# Patient Record
Sex: Male | Born: 1965 | Race: White | Hispanic: No | Marital: Married | Smoking: Current some day smoker
Health system: Southern US, Community
[De-identification: ages and names within clinical notes are randomized; demographics above are authoritative.]

## PROBLEM LIST (undated history)

## (undated) DIAGNOSIS — E785 Hyperlipidemia, unspecified: Secondary | ICD-10-CM

## (undated) DIAGNOSIS — F329 Major depressive disorder, single episode, unspecified: Secondary | ICD-10-CM

## (undated) DIAGNOSIS — F32A Depression, unspecified: Secondary | ICD-10-CM

---

## 2006-07-08 ENCOUNTER — Emergency Department: Payer: Self-pay | Admitting: General Practice

## 2009-11-19 ENCOUNTER — Emergency Department: Payer: Self-pay | Admitting: Emergency Medicine

## 2012-01-03 ENCOUNTER — Emergency Department: Payer: Self-pay | Admitting: Emergency Medicine

## 2012-01-08 ENCOUNTER — Emergency Department: Payer: Self-pay | Admitting: Emergency Medicine

## 2012-12-29 ENCOUNTER — Emergency Department: Payer: Self-pay | Admitting: Emergency Medicine

## 2013-01-08 ENCOUNTER — Emergency Department: Payer: Self-pay | Admitting: Internal Medicine

## 2014-03-06 ENCOUNTER — Emergency Department: Payer: Self-pay | Admitting: Student

## 2014-04-04 ENCOUNTER — Emergency Department: Payer: Self-pay | Admitting: Internal Medicine

## 2014-05-05 ENCOUNTER — Emergency Department: Payer: Self-pay | Admitting: Emergency Medicine

## 2014-05-05 LAB — CBC WITH DIFFERENTIAL/PLATELET
BASOS PCT: 1.1 %
Basophil #: 0.1 10*3/uL (ref 0.0–0.1)
EOS PCT: 5.8 %
Eosinophil #: 0.3 10*3/uL (ref 0.0–0.7)
HCT: 42.8 % (ref 40.0–52.0)
HGB: 14.3 g/dL (ref 13.0–18.0)
LYMPHS ABS: 2.7 10*3/uL (ref 1.0–3.6)
Lymphocyte %: 45.5 %
MCH: 28.7 pg (ref 26.0–34.0)
MCHC: 33.4 g/dL (ref 32.0–36.0)
MCV: 86 fL (ref 80–100)
Monocyte #: 0.6 x10 3/mm (ref 0.2–1.0)
Monocyte %: 9.7 %
NEUTROS PCT: 37.9 %
Neutrophil #: 2.2 10*3/uL (ref 1.4–6.5)
Platelet: 231 10*3/uL (ref 150–440)
RBC: 4.98 10*6/uL (ref 4.40–5.90)
RDW: 13.3 % (ref 11.5–14.5)
WBC: 5.9 10*3/uL (ref 3.8–10.6)

## 2014-05-05 LAB — BASIC METABOLIC PANEL
ANION GAP: 5 — AB (ref 7–16)
BUN: 21 mg/dL — AB (ref 7–18)
CHLORIDE: 105 mmol/L (ref 98–107)
CREATININE: 1.22 mg/dL (ref 0.60–1.30)
Calcium, Total: 9.3 mg/dL (ref 8.5–10.1)
Co2: 31 mmol/L (ref 21–32)
EGFR (African American): >60
EGFR (Non-African Amer.): >60
GLUCOSE: 105 mg/dL — AB (ref 65–99)
Osmolality: 285 (ref 275–301)
POTASSIUM: 4.9 mmol/L (ref 3.5–5.1)
Sodium: 141 mmol/L (ref 136–145)

## 2014-05-05 LAB — URINALYSIS, COMPLETE
Bacteria: NONE SEEN
Bilirubin,UR: NEGATIVE
Blood: NEGATIVE
Glucose,UR: NEGATIVE mg/dL (ref 0–75)
KETONE: NEGATIVE
Leukocyte Esterase: NEGATIVE
Nitrite: NEGATIVE
PH: 5 (ref 4.5–8.0)
Protein: NEGATIVE
SPECIFIC GRAVITY: 1.019 (ref 1.003–1.030)
SQUAMOUS EPITHELIAL: NONE SEEN
WBC UR: 1 /HPF (ref 0–5)

## 2014-07-07 ENCOUNTER — Emergency Department
Admit: 2014-07-07 | Disposition: A | Discharge status: Home / Self Care | Payer: Self-pay | Admitting: Emergency Medicine

## 2015-10-27 IMAGING — US US SCROTUM W/ DOPPLER COMPLETE
1 series · 14 of 25 positions shown · non-contrast
Comparison: None.

CLINICAL DATA: Right testicular pain for 1 week.

EXAM:
SCROTAL ULTRASOUND
DOPPLER ULTRASOUND OF THE TESTICLES
TECHNIQUE: Complete ultrasound examination of the testicles, epididymis, and
other scrotal structures was performed. Color and spectral Doppler
ultrasound were also utilized to evaluate blood flow to the
testicles.

[Series 1: us scrotum w/ doppler complete · 0.08mm/px · 14 of 64 slices shown]
[im 1/64]
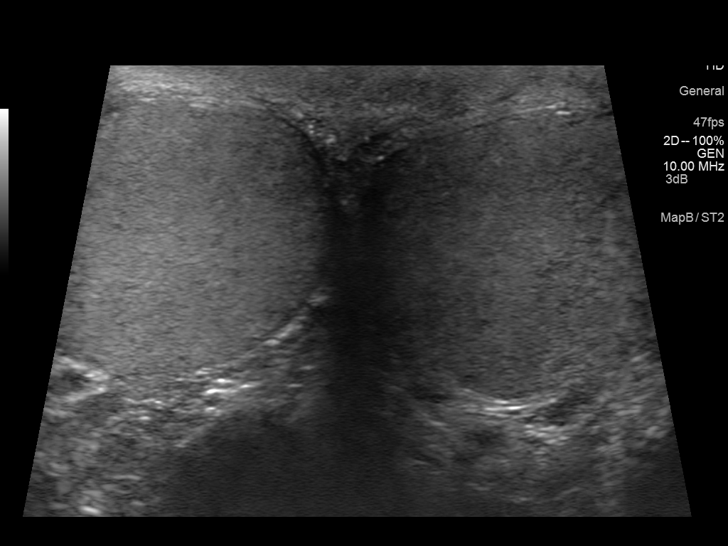
[im 6/64]
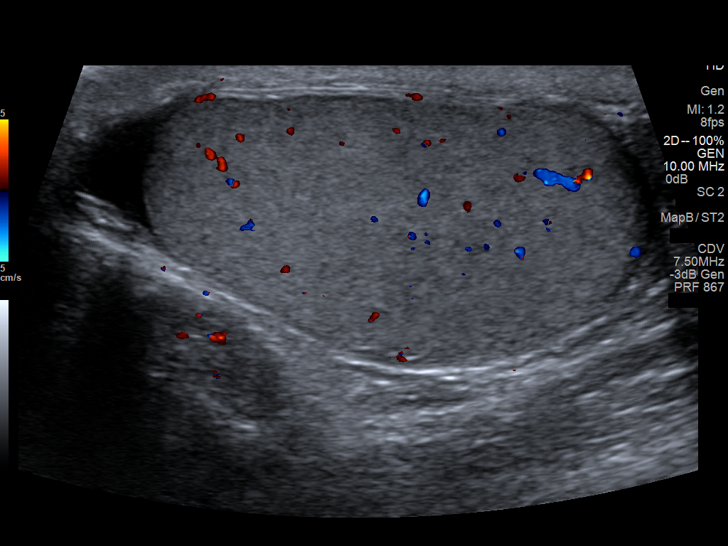
[im 11/64]
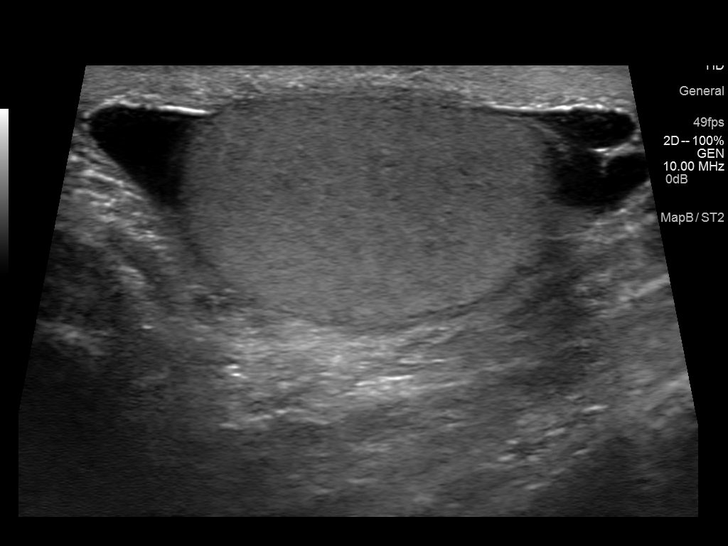
[im 16/64]
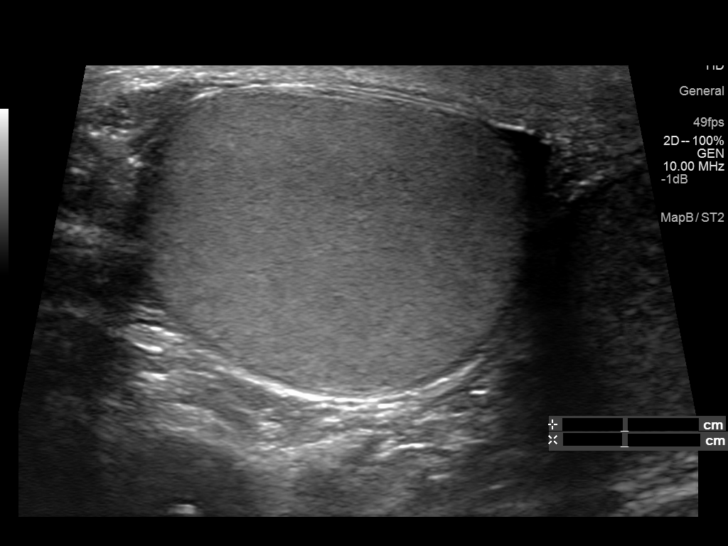
[im 22/64]
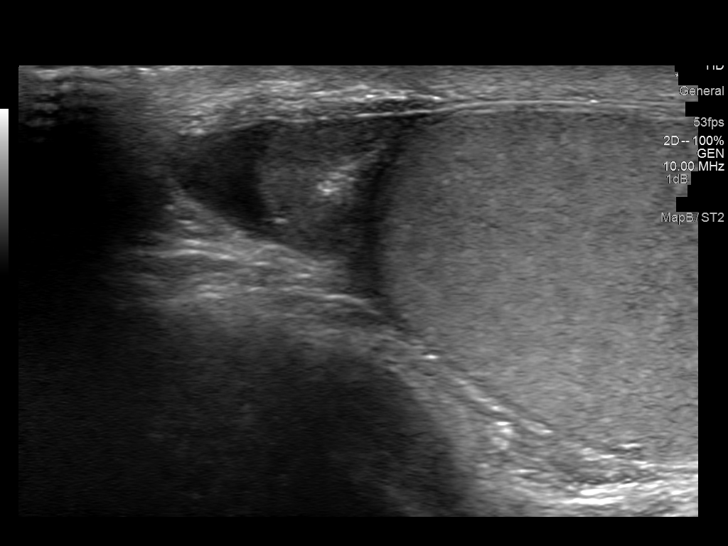
[im 24/64]
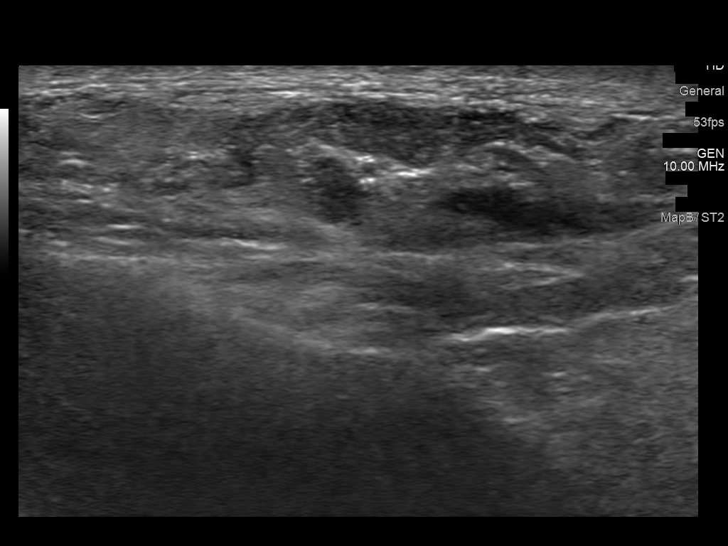
[im 29/64]
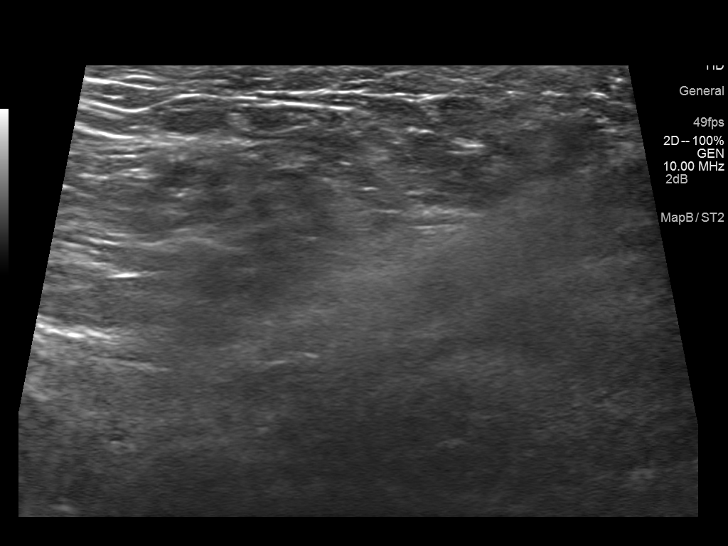
[im 35/64]
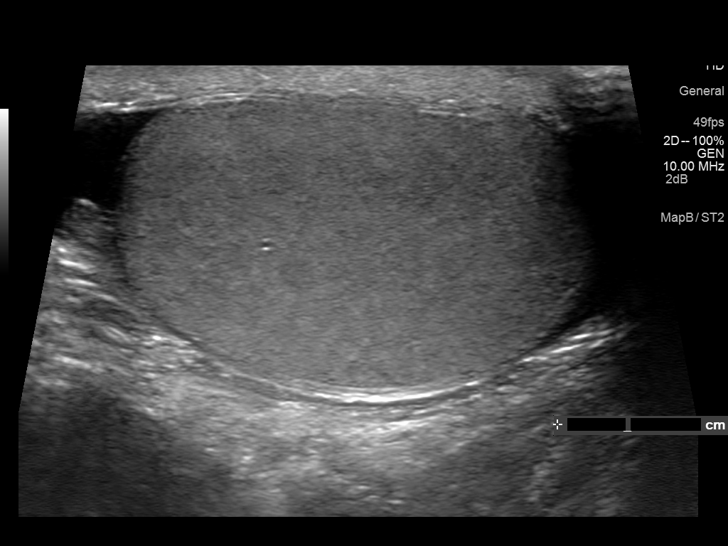
[im 40/64]
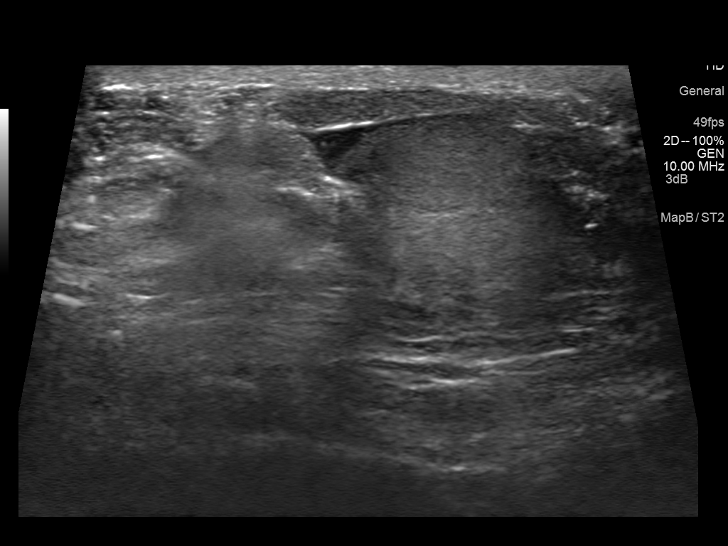
[im 43/64]
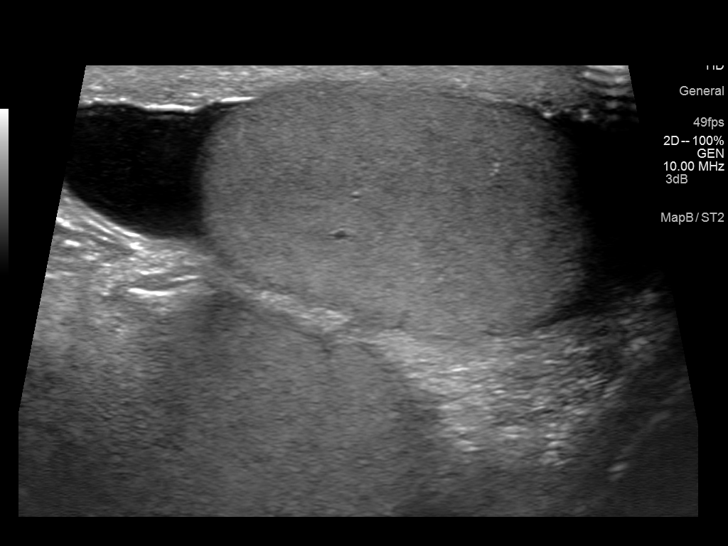
[im 48/64]
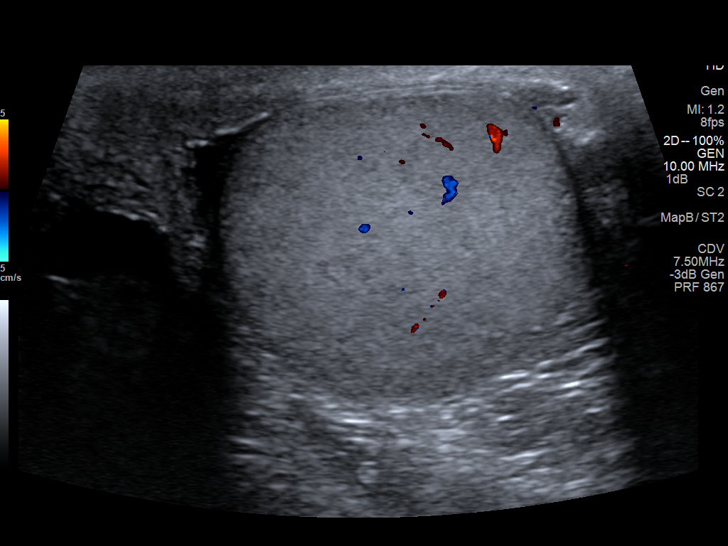
[im 53/64]
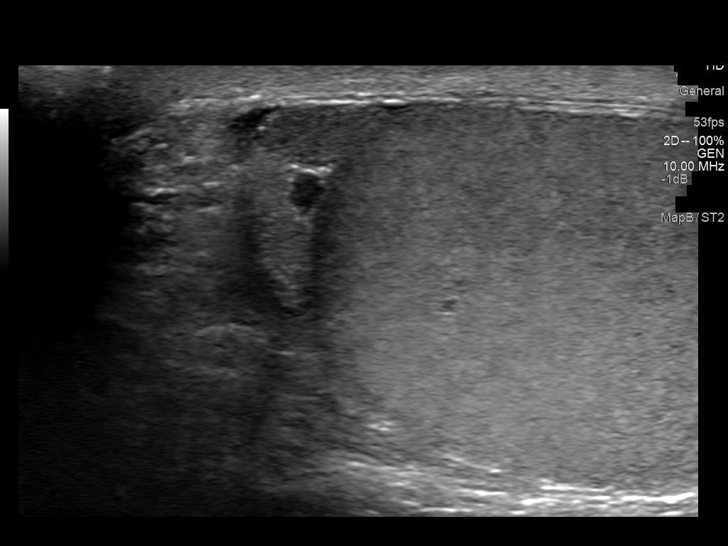
[im 58/64]
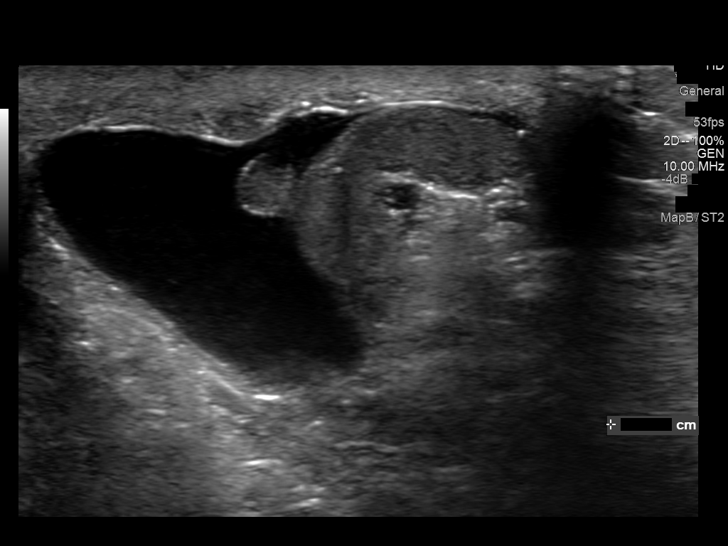
[im 64/64]
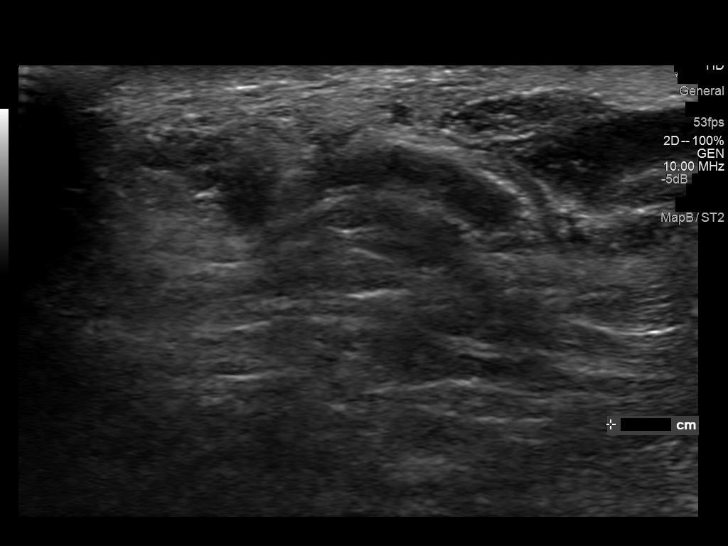

[14 of 25 positions shown; findings below may reference images not displayed]

FINDINGS: Right testicle

Measurements: 5.3 x 3.2 x 4.1 cm. No mass or microlithiasis
visualized.

Left testicle

Measurements: 5 x 3.8 x 3.5 cm. No mass or microlithiasis
visualized.

Right epididymis:  Normal in size and appearance.

Left epididymis: Normal in size and vascularity. Incidental 4 mm
cyst.

Hydrocele:  Trace bilateral hydrocele.

Varicocele: Mild left varicocele with testicular veins dilating up
to 4 mm

Pulsed Doppler interrogation of both testes demonstrates low
resistance arterial and venous waveforms bilaterally.
IMPRESSION: 1. No acute findings to explain pain.  No testicular torsion.
2. Mild left varicocele.

## 2016-09-05 ENCOUNTER — Encounter: Payer: Self-pay | Admitting: Emergency Medicine

## 2016-09-05 ENCOUNTER — Emergency Department
Admission: EM | Admit: 2016-09-05 | Discharge: 2016-09-05 | Disposition: A | Discharge status: Home / Self Care | Payer: Non-veteran care | Attending: Emergency Medicine | Admitting: Emergency Medicine

## 2016-09-05 DIAGNOSIS — Z23 Encounter for immunization: Secondary | ICD-10-CM | POA: Insufficient documentation

## 2016-09-05 DIAGNOSIS — S6992XA Unspecified injury of left wrist, hand and finger(s), initial encounter: Secondary | ICD-10-CM | POA: Diagnosis present

## 2016-09-05 DIAGNOSIS — W231XXA Caught, crushed, jammed, or pinched between stationary objects, initial encounter: Secondary | ICD-10-CM | POA: Insufficient documentation

## 2016-09-05 DIAGNOSIS — S61217A Laceration without foreign body of left little finger without damage to nail, initial encounter: Secondary | ICD-10-CM | POA: Diagnosis not present

## 2016-09-05 DIAGNOSIS — Y929 Unspecified place or not applicable: Secondary | ICD-10-CM | POA: Diagnosis not present

## 2016-09-05 DIAGNOSIS — L237 Allergic contact dermatitis due to plants, except food: Secondary | ICD-10-CM | POA: Insufficient documentation

## 2016-09-05 DIAGNOSIS — F17200 Nicotine dependence, unspecified, uncomplicated: Secondary | ICD-10-CM | POA: Diagnosis not present

## 2016-09-05 DIAGNOSIS — Y998 Other external cause status: Secondary | ICD-10-CM | POA: Insufficient documentation

## 2016-09-05 DIAGNOSIS — Y9389 Activity, other specified: Secondary | ICD-10-CM | POA: Diagnosis not present

## 2016-09-05 HISTORY — DX: Depression, unspecified: F32.A

## 2016-09-05 HISTORY — DX: Major depressive disorder, single episode, unspecified: F32.9

## 2016-09-05 MED ORDER — TETANUS-DIPHTH-ACELL PERTUSSIS 5-2.5-18.5 LF-MCG/0.5 IM SUSP
0.5000 mL | Freq: Once | INTRAMUSCULAR | Status: AC
  Administered 2016-09-05: 0.5 mL via INTRAMUSCULAR
  Filled 2016-09-05: qty 0.5

## 2016-09-05 MED ORDER — HYDROCORTISONE 1 % EX LOTN: 1.0000 "application " | TOPICAL_LOTION | Freq: Two times a day (BID) | CUTANEOUS | 0 refills | Status: DC

## 2016-09-05 NOTE — ED Triage Notes (Signed)
States he caught his left 5 th finger tailgate latch  Laceration to finger

## 2016-09-05 NOTE — Discharge Instructions (Signed)
Keep wound clean, dry and covered. If the wounds begins to show signs of infection: redness, warmth, drainage, etc return to the emergency department.

## 2016-09-05 NOTE — ED Provider Notes (Signed)
[facility] Emergency Department Provider Note   ____________________________________________   I have reviewed the triage vital signs and the nursing notes.   HISTORY  Chief Complaint Laceration    HPI Bobby Zuniga is a 51 y.o. male presents with small laceration to the fifth digit of the left hand. Patient describes attempting to unhook trailer earlier today when his finger got caught in the latch release sustaining the laceration. Patient maintained hemorrhage control until arriving to the emergency department. Patient unable to recall his tetanus status. He also complained of rash along the left waistline he confirmed he had been exposed to poison ivy. Patient reported itching and irritation along the rash area. Patient denies fever, chills, headache, vision changes, chest pain, chest tightness, shortness of breath, abdominal pain, nausea and vomiting.  Past Medical History:  Diagnosis Date  . Depression     There are no active problems to display for this patient.   History reviewed. No pertinent surgical history.  Prior to Admission medications   Medication Sig Start Date End Date Taking? Authorizing Provider  hydrocortisone 1 % lotion Apply 1 application topically 2 (two) times daily. 09/05/16   Dillan Candela M, PA-C    Allergies Patient has no known allergies.  No family history on file.  Social History Social History  Substance Use Topics  . Smoking status: Current Some Day Smoker  . Smokeless tobacco: Never Used  . Alcohol use No    Review of Systems Constitutional: Negative for fever/chills Eyes: No visual changes. ENT:  Negative for sore throat and for difficulty swallowing Cardiovascular: Denies chest pain. Respiratory: Denies cough Denies shortness of breath. Musculoskeletal: Negative for back pain. Negative for generalized body aches. Skin:Positive for rash along the left waist line. One and a half superficial laceration  to the left fifth digit.  Neurological: Negative for headaches.  Negative focal weakness or numbness. ____________________________________________   PHYSICAL EXAM:  VITAL SIGNS: ED Triage Vitals  Enc Vitals Group     BP 09/05/16 1532 132/82     Pulse --      Resp 09/05/16 1532 18     Temp 09/05/16 1532 98.3 F (36.8 C)     Temp Source 09/05/16 1532 Oral     SpO2 09/05/16 1532 96 %     Weight 09/05/16 1535 248 lb (112.5 kg)     Height 09/05/16 1535 6' (1.829 m)     Head Circumference --      Peak Flow --      Pain Score 09/05/16 1535 2     Pain Loc --      Pain Edu? --      Excl. in GC? --     Constitutional: Alert and oriented. Well appearing and in no acute distress.  Head: Normocephalic and atraumatic. Eyes: Conjunctivae are normal.  Cardiovascular: Normal rate, regular rhythm. Normal distal pulses. Respiratory: Normal respiratory effort. No wheezes/rales/rhonchi.  Musculoskeletal: Nontender with normal range of motion in all extremities. Neurologic: Normal speech and language.  Skin:  Skin is warm, dry and intact. Rash along left side of waist band consistent with poison ivy. Dried blisters healing. Left fifth digit laceration approximately 1.5 7 m superficial requiring Dermabond closure. Psychiatric: Mood and affect are normal.  ____________________________________________   LABS (all labs ordered are listed, but only abnormal results are displayed)  Labs Reviewed - No data to display ____________________________________________  EKG None  ____________________________________________  RADIOLOGY none ____________________________________________   PROCEDURES  Procedure(s) performed: LACERATION REPAIR  Performed by: Clois Comberraci M Onetha Gaffey Authorized by: Clois Comberraci M Zaraya Delauder Consent: Verbal consent obtained. Risks and benefits: risks, benefits and alternatives were discussed Consent given by: patient Patient identity confirmed: provided demographic data Prepped and  Draped in normal sterile fashion Wound explored  Laceration Location: left fifth digit, superficial laceration  Laceration Length: 1.5 cm  No Foreign Bodies seen or palpated  Irrigation method: syringe Amount of cleaning: standard  Skin closure: Dermabond f/b steri-strip   Patient tolerance: Patient tolerated the procedure well with no immediate complications.    Critical Care performed: no ____________________________________________   INITIAL IMPRESSION / ASSESSMENT AND PLAN / ED COURSE  Pertinent labs & imaging results that were available during my care of the patient were reviewed by me and considered in my medical decision making (see chart for details).   Patient sustained a superficial laceration left fifth digit.  Assessment confirmed movement and sensation of the digit before and after wound closure. Laceration required Dermabond f./b steri-strip closure as noted above. Patient tolerated procedure well. Pt instructed to keep wound clean and dry. Patient received tetanus vaccine. Patient's rash consistent with poison ivy dermatitis. Patient will be prescribed hydrocortisone cream to apply as directed. Patient also instructed to watch for signs of infection and return if changes are noted. Patient  informed of clinical course, understand medical decision-making process, and agree with plan.  Patient was advised to follow up with PCP as needed and was also advised to return to the emergency department for symptoms that change or worsen.      ____________________________________________   FINAL CLINICAL IMPRESSION(S) / ED DIAGNOSES  Final diagnoses:  Laceration of left Jayanna Kroeger finger without foreign body without damage to nail, initial encounter  Contact dermatitis due to poison ivy       NEW MEDICATIONS STARTED DURING THIS VISIT:  Discharge Medication List as of 09/05/2016  4:15 PM    START taking these medications   Details  hydrocortisone 1 % lotion Apply 1  application topically 2 (two) times daily., Starting Tue 09/05/2016, Print         Note:  This document was prepared using Dragon voice recognition software and may include unintentional dictation errors.   Percell BostonLittle, Albaro Deviney M, PA-C 09/05/16 Orland [id number]    Kinner, Robert, MD 09/08/16 [phone]

## 2017-05-07 ENCOUNTER — Encounter: Payer: Self-pay | Admitting: Emergency Medicine

## 2017-05-07 ENCOUNTER — Other Ambulatory Visit: Payer: Self-pay

## 2017-05-07 ENCOUNTER — Emergency Department
Admission: EM | Admit: 2017-05-07 | Discharge: 2017-05-07 | Disposition: A | Discharge status: Home / Self Care | Payer: Non-veteran care | Attending: Emergency Medicine | Admitting: Emergency Medicine

## 2017-05-07 ENCOUNTER — Emergency Department: Payer: Non-veteran care

## 2017-05-07 DIAGNOSIS — F17200 Nicotine dependence, unspecified, uncomplicated: Secondary | ICD-10-CM | POA: Diagnosis not present

## 2017-05-07 DIAGNOSIS — J101 Influenza due to other identified influenza virus with other respiratory manifestations: Secondary | ICD-10-CM | POA: Diagnosis not present

## 2017-05-07 DIAGNOSIS — R509 Fever, unspecified: Secondary | ICD-10-CM | POA: Diagnosis present

## 2017-05-07 DIAGNOSIS — F329 Major depressive disorder, single episode, unspecified: Secondary | ICD-10-CM | POA: Insufficient documentation

## 2017-05-07 LAB — INFLUENZA PANEL BY PCR (TYPE A & B)
INFLAPCR: POSITIVE — AB
INFLBPCR: NEGATIVE

## 2017-05-07 MED ORDER — OSELTAMIVIR PHOSPHATE 75 MG PO CAPS: 75.0000 mg | ORAL_CAPSULE | Freq: Two times a day (BID) | ORAL | 0 refills | Status: AC

## 2017-05-07 MED ORDER — HYDROCOD POLST-CPM POLST ER 10-8 MG/5ML PO SUER: 5.0000 mL | Freq: Two times a day (BID) | ORAL | 0 refills | Status: DC | PRN

## 2017-05-07 NOTE — Discharge Instructions (Signed)
Follow-up with your [facility] the [facility] if any continued problems or not improving.  Return to the emergency department if any severe worsening of your symptoms.  Continue your regular medication.  Begin Tamiflu 75 mg twice daily for 5 days and Tussionex every 12 hours if needed for cough.  Increase fluids.  Tylenol as needed for fever.

## 2017-05-07 NOTE — ED Provider Notes (Signed)
[facility] Emergency Department Provider Note  ___________________________________________   First MD Initiated Contact with Patient 05/07/17 1425     (approximate)  I have reviewed the triage vital signs and the nursing notes.   HISTORY  Chief Complaint Cough  HPI Bobby Zuniga is a 52 y.o. male is here with complaint of sudden onset chills and fever that started yesterday.  Patient states he is also been coughing and short of breath.  Patient's family member states that his temperature was "111"  this morning and was given ibuprofen at approximately 10 AM.  Patient denies any nausea, vomiting or diarrhea.  Patient is a smoker.     Past Medical History:  Diagnosis Date  . Depression     There are no active problems to display for this patient.   History reviewed. No pertinent surgical history.  Prior to Admission medications   Medication Sig Start Date End Date Taking? Authorizing Provider  chlorpheniramine-HYDROcodone (TUSSIONEX PENNKINETIC ER) 10-8 MG/5ML SUER Take 5 mLs by mouth every 12 (twelve) hours as needed for cough. 05/07/17   Tommi Rumps, PA-C  hydrocortisone 1 % lotion Apply 1 application topically 2 (two) times daily. 09/05/16   Little, Traci M, PA-C  oseltamivir (TAMIFLU) 75 MG capsule Take 1 capsule (75 mg total) by mouth 2 (two) times daily for 5 days. 05/07/17 05/12/17  Tommi Rumps, PA-C    Allergies Patient has no known allergies.  No family history on file.  Social History Social History   Tobacco Use  . Smoking status: Current Some Day Smoker  . Smokeless tobacco: Never Used  Substance Use Topics  . Alcohol use: No  . Drug use: No    Review of Systems Constitutional: Positive fever/chills Eyes: No visual changes. ENT: No sore throat. Cardiovascular: Denies chest pain. Respiratory: Denies shortness of breath.  Positive cough. Gastrointestinal: No abdominal pain.  No nausea, no vomiting.  No diarrhea.     Musculoskeletal: Negative for back pain. Skin: Negative for rash. Neurological: Negative for headaches, focal weakness or numbness. ____________________________________________   PHYSICAL EXAM:  VITAL SIGNS: ED Triage Vitals  Enc Vitals Group     BP 05/07/17 1407 (!) 142/99     Pulse Rate 05/07/17 1407 89     Resp 05/07/17 1407 16     Temp 05/07/17 1407 99 F (37.2 C)     Temp Source 05/07/17 1407 Oral     SpO2 --      Weight 05/07/17 1406 247 lb (112 kg)     Height 05/07/17 1406 6' (1.829 m)     Head Circumference --      Peak Flow --      Pain Score 05/07/17 1406 8     Pain Loc --      Pain Edu? --      Excl. in GC? --    Constitutional: Alert and oriented. Well appearing and in no acute distress. Eyes: Conjunctivae are normal.  Head: Atraumatic. Nose: Mild congestion/rhinnorhea. Mouth/Throat: Mucous membranes are moist.  Oropharynx non-erythematous. Neck: No stridor.   Hematological/Lymphatic/Immunilogical: No cervical lymphadenopathy. Cardiovascular: Normal rate, regular rhythm. Grossly normal heart sounds.  Good peripheral circulation. Respiratory: Normal respiratory effort.  No retractions. Lungs CTAB. Gastrointestinal: Soft and nontender. No distention.  Musculoskeletal: Moves upper and lower extremities without any difficulty.  Normal gait was noted. Neurologic:  Normal speech and language. No gross focal neurologic deficits are appreciated.  Skin:  Skin is warm, dry and intact. No rash  noted. Psychiatric: Mood and affect are normal. Speech and behavior are normal.  ____________________________________________   LABS (all labs ordered are listed, but only abnormal results are displayed)  Labs Reviewed  INFLUENZA PANEL BY PCR (TYPE A & B) - Abnormal; Notable for the following components:      Result Value   Influenza A By PCR POSITIVE (*)    All other components within normal limits   RADIOLOGY  ED MD interpretation:   Chest x-ray negative for  pneumonia.  Official radiology report(s): Dg Chest 2 View  Result Date: 05/07/2017 CLINICAL DATA:  Cough and fever. EXAM: CHEST  2 VIEW COMPARISON:  None FINDINGS: The heart size and mediastinal contours are within normal limits. Both lungs are clear except for peribronchial thickening. No effusions. The visualized skeletal structures are unremarkable. IMPRESSION: Bronchitic changes. Electronically Signed   By: Francene BoyersJames  Maxwell M.D.   On: 05/07/2017 15:46    ____________________________________________   PROCEDURES  Procedure(s) performed: None  Procedures  Critical Care performed: No  ____________________________________________   INITIAL IMPRESSION / ASSESSMENT AND PLAN / ED COURSE Patient and family were made aware that his influenza test is positive.  Because symptoms came on suddenly yesterday patient was placed on Tamiflu 75 mg twice daily for 5 days.  He was also given a prescription for Tussionex 1 teaspoon every 12 hours if needed for cough.  He is encouraged to increase fluids and take Tylenol or ibuprofen as needed for fever.  He will [facility] or return to the emergency department if any severe worsening of his symptoms.  ____________________________________________   FINAL CLINICAL IMPRESSION(S) / ED DIAGNOSES  Final diagnoses:  Influenza A     ED Discharge Orders        Ordered    oseltamivir (TAMIFLU) 75 MG capsule  2 times daily     05/07/17 1556    chlorpheniramine-HYDROcodone (TUSSIONEX PENNKINETIC ER) 10-8 MG/5ML SUER  Every 12 hours PRN     05/07/17 1556       Note:  This document was prepared using Dragon voice recognition software and may include unintentional dictation errors.    Tommi RumpsSummers, Rhonda L, PA-C 05/07/17 1621    Emily FilbertWilliams, Jonathan E, MD 05/08/17 [phone]

## 2017-05-07 NOTE — ED Notes (Signed)
See triage note   Developed chills,fever and body aches since last pm  Also has cough  Low grade fever on arrival

## 2017-05-07 NOTE — ED Triage Notes (Addendum)
Arrives with C/O chills and fever yesterday.  Today coughing and SOB.  Ibuprofen given this morning at 1000.

## 2017-11-09 ENCOUNTER — Emergency Department
Admission: EM | Admit: 2017-11-09 | Discharge: 2017-11-09 | Disposition: A | Discharge status: Home / Self Care | Payer: Non-veteran care | Attending: Emergency Medicine | Admitting: Emergency Medicine

## 2017-11-09 ENCOUNTER — Emergency Department: Payer: Non-veteran care

## 2017-11-09 ENCOUNTER — Other Ambulatory Visit: Payer: Self-pay

## 2017-11-09 DIAGNOSIS — S8991XA Unspecified injury of right lower leg, initial encounter: Secondary | ICD-10-CM

## 2017-11-09 DIAGNOSIS — F17210 Nicotine dependence, cigarettes, uncomplicated: Secondary | ICD-10-CM | POA: Diagnosis not present

## 2017-11-09 DIAGNOSIS — M25561 Pain in right knee: Secondary | ICD-10-CM | POA: Diagnosis present

## 2017-11-09 HISTORY — DX: Hyperlipidemia, unspecified: E78.5

## 2017-11-09 MED ORDER — KETOROLAC TROMETHAMINE 30 MG/ML IJ SOLN
30.0000 mg | Freq: Once | INTRAMUSCULAR | Status: AC
  Administered 2017-11-09: 30 mg via INTRAMUSCULAR
  Filled 2017-11-09: qty 1

## 2017-11-09 MED ORDER — KETOROLAC TROMETHAMINE 10 MG PO TABS: 10.0000 mg | ORAL_TABLET | Freq: Four times a day (QID) | ORAL | 0 refills | Status: DC | PRN

## 2017-11-09 NOTE — ED Triage Notes (Signed)
Pt c/o right knee pain, states he thinks he injured it.

## 2017-11-09 NOTE — ED Provider Notes (Signed)
[facility] Emergency Department Provider Note  ____________________________________________  Time seen: Approximately 8:53 AM  I have reviewed the triage vital signs and the nursing notes.   HISTORY  Chief Complaint Knee Injury    HPI Bobby Zuniga is a 52 y.o. male that presents to emergency department for evaluation of right knee pain for 1 week.  Patient states that he was at the gym and was using 1 of the leg machines, when he hyperextended his knee.  He has having pain on the front below his kneecap.  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  He has been walking but with pain.  No alleviating measures have been attempted.  No swelling.  No additional injuries.  Past Medical History:  Diagnosis Date  . Depression   . Hyperlipemia     There are no active problems to display for this patient.   History reviewed. No pertinent surgical history.  Prior to Admission medications   Medication Sig Start Date End Date Taking? Authorizing Provider  chlorpheniramine-HYDROcodone (TUSSIONEX PENNKINETIC ER) 10-8 MG/5ML SUER Take 5 mLs by mouth every 12 (twelve) hours as needed for cough. 05/07/17   Tommi RumpsSummers, Rhonda L, PA-C  hydrocortisone 1 % lotion Apply 1 application topically 2 (two) times daily. 09/05/16   Little, Traci M, PA-C  ketorolac (TORADOL) 10 MG tablet Take 1 tablet (10 mg total) by mouth every 6 (six) hours as needed. 11/09/17   Enid DerryWagner, Velna Hedgecock, PA-C    Allergies Patient has no known allergies.  No family history on file.  Social History Social History   Tobacco Use  . Smoking status: Current Some Day Smoker  . Smokeless tobacco: Never Used  Substance Use Topics  . Alcohol use: No  . Drug use: No     Review of Systems  Musculoskeletal: Positive for knee pain.  Skin: Negative for rash, abrasions, lacerations, ecchymosis. Neurological: Negative for headaches, numbness or  tingling   ____________________________________________   PHYSICAL EXAM:  VITAL SIGNS: ED Triage Vitals  Enc Vitals Group     BP 11/09/17 0847 (!) 142/79     Pulse Rate 11/09/17 0847 75     Resp 11/09/17 0847 17     Temp 11/09/17 0850 98 F (36.7 C)     Temp Source 11/09/17 0847 Oral     SpO2 11/09/17 0847 100 %     Weight 11/09/17 0848 242 lb (109.8 kg)     Height 11/09/17 0848 6' (1.829 m)     Head Circumference --      Peak Flow --      Pain Score 11/09/17 0847 7     Pain Loc --      Pain Edu? --      Excl. in GC? --      Constitutional: Alert and oriented. Well appearing and in no acute distress. Eyes: Conjunctivae are normal. PERRL. EOMI. Head: Atraumatic. ENT:      Ears:      Nose: No congestion/rhinnorhea.      Mouth/Throat: Mucous membranes are moist.  Neck: No stridor.  Cardiovascular: Normal rate, regular rhythm.  Good peripheral circulation. Respiratory: Normal respiratory effort without tachypnea or retractions. Lungs CTAB. Good air entry to the bases with no decreased or absent breath sounds. Musculoskeletal: Full range of motion to all extremities. No gross deformities appreciated.  Tenderness to palpation inferior to patella. No effusion noted. Negative anterior drawer, posterior drawer, valgus, varus, mcMurray, patella apprehension, apley  grind. Neurologic:  Normal speech and language. No gross focal neurologic deficits are appreciated.  Skin:  Skin is warm, dry and intact. No rash noted. Psychiatric: Mood and affect are normal. Speech and behavior are normal. Patient exhibits appropriate insight and judgement.   ____________________________________________   LABS (all labs ordered are listed, but only abnormal results are displayed)  Labs Reviewed - No data to display ____________________________________________  EKG   ____________________________________________  RADIOLOGY Lexine Baton, personally viewed and evaluated these images  (plain radiographs) as part of my medical decision making, as well as reviewing the written report by the radiologist.  Dg Knee Complete 4 Views Right  Result Date: 11/09/2017 CLINICAL DATA:  Right knee pain. EXAM: RIGHT KNEE - COMPLETE 4+ VIEW COMPARISON:  None. FINDINGS: No evidence of fracture, dislocation, or joint effusion. No evidence of arthropathy or other focal bone abnormality. Soft tissues are unremarkable. IMPRESSION: Normal right knee. Electronically Signed   By: Lupita Raider, M.D.   On: 11/09/2017 09:33    ____________________________________________    PROCEDURES  Procedure(s) performed:    Procedures    Medications  ketorolac (TORADOL) 30 MG/ML injection 30 mg (30 mg Intramuscular Given 11/09/17 0954)     ____________________________________________   INITIAL IMPRESSION / ASSESSMENT AND PLAN / ED COURSE  Pertinent labs & imaging results that were available during my care of the patient were reviewed by me and considered in my medical decision making (see chart for details).  Review of the [place] CSRS was performed in accordance of the [facility] prior to dispensing any controlled drugs.   Patient presented to the emergency department for evaluation of knee pain for 1 week.  Vital signs and exam are reassuring.  Knee x-ray negative for bony abnormalities.  Knee exam unremarkable.  Patient will be discharged home with prescriptions for Toradol. Patient is to follow up with Ortho as directed. Patient is given ED precautions to return to the ED for any worsening or new symptoms.     ____________________________________________  FINAL CLINICAL IMPRESSION(S) / ED DIAGNOSES  Final diagnoses:  Injury of right knee, initial encounter      NEW MEDICATIONS STARTED DURING THIS VISIT:  ED Discharge Orders         Ordered    ketorolac (TORADOL) 10 MG tablet  Every 6 hours PRN     11/09/17 0959              This chart was dictated using voice recognition  software/Dragon. Despite best efforts to proofread, errors can occur which can change the meaning. Any change was purely unintentional.    Enid Derry, PA-C 11/09/17 1526    Don Perking, [place], MD 11/10/17 [phone]

## 2018-01-09 ENCOUNTER — Emergency Department
Admission: EM | Admit: 2018-01-09 | Discharge: 2018-01-09 | Disposition: A | Discharge status: Home / Self Care | Payer: Non-veteran care | Attending: Emergency Medicine | Admitting: Emergency Medicine

## 2018-01-09 ENCOUNTER — Encounter: Payer: Self-pay | Admitting: Emergency Medicine

## 2018-01-09 ENCOUNTER — Other Ambulatory Visit: Payer: Self-pay

## 2018-01-09 DIAGNOSIS — Z79899 Other long term (current) drug therapy: Secondary | ICD-10-CM | POA: Diagnosis not present

## 2018-01-09 DIAGNOSIS — Y999 Unspecified external cause status: Secondary | ICD-10-CM | POA: Insufficient documentation

## 2018-01-09 DIAGNOSIS — F17200 Nicotine dependence, unspecified, uncomplicated: Secondary | ICD-10-CM | POA: Insufficient documentation

## 2018-01-09 DIAGNOSIS — S0502XA Injury of conjunctiva and corneal abrasion without foreign body, left eye, initial encounter: Secondary | ICD-10-CM | POA: Diagnosis not present

## 2018-01-09 DIAGNOSIS — Y929 Unspecified place or not applicable: Secondary | ICD-10-CM | POA: Diagnosis not present

## 2018-01-09 DIAGNOSIS — Y93H9 Activity, other involving exterior property and land maintenance, building and construction: Secondary | ICD-10-CM | POA: Diagnosis not present

## 2018-01-09 DIAGNOSIS — X58XXXA Exposure to other specified factors, initial encounter: Secondary | ICD-10-CM | POA: Diagnosis not present

## 2018-01-09 DIAGNOSIS — S0592XA Unspecified injury of left eye and orbit, initial encounter: Secondary | ICD-10-CM | POA: Diagnosis present

## 2018-01-09 MED ORDER — EYE WASH OPHTH SOLN
1.0000 [drp] | OPHTHALMIC | Status: DC | PRN
  Administered 2018-01-09: 1 [drp] via OPHTHALMIC

## 2018-01-09 MED ORDER — FLUORESCEIN SODIUM 1 MG OP STRP
1.0000 | ORAL_STRIP | Freq: Once | OPHTHALMIC | Status: AC
  Administered 2018-01-09: 1 via OPHTHALMIC

## 2018-01-09 MED ORDER — TETRACAINE HCL 0.5 % OP SOLN
1.0000 [drp] | Freq: Once | OPHTHALMIC | Status: AC
  Administered 2018-01-09: 1 [drp] via OPHTHALMIC

## 2018-01-09 MED ORDER — GENTAMICIN SULFATE 0.3 % OP SOLN: 1.0000 [drp] | OPHTHALMIC | 0 refills | Status: DC

## 2018-01-09 MED ORDER — FLUORESCEIN SODIUM 1 MG OP STRP
ORAL_STRIP | OPHTHALMIC | Status: AC
  Administered 2018-01-09: 1 via OPHTHALMIC
  Filled 2018-01-09: qty 1

## 2018-01-09 MED ORDER — EYE WASH OPHTH SOLN
OPHTHALMIC | Status: AC
  Administered 2018-01-09: 1 [drp] via OPHTHALMIC
  Filled 2018-01-09: qty 118

## 2018-01-09 MED ORDER — TETRACAINE HCL 0.5 % OP SOLN
OPHTHALMIC | Status: AC
  Administered 2018-01-09: 1 [drp] via OPHTHALMIC
  Filled 2018-01-09: qty 4

## 2018-01-09 NOTE — Discharge Instructions (Addendum)
Use eye drops as directed.

## 2018-01-09 NOTE — ED Provider Notes (Signed)
[facility] Emergency Department Provider Note   ____________________________________________   First MD Initiated Contact with Patient 01/09/18 1206     (approximate)  I have reviewed the triage vital signs and the nursing notes.   HISTORY  Chief Complaint Eye Problem    HPI Bobby Zuniga is a 52 y.o. male patient complain of left eye pain for 3 days.  Patient incident occurred while he was cutting grass.  Patient state foreign body sensation but no vision disturbance.  Patient state a pharmacist told us that he had a foreign body in his eye.  Patient rates his pain as a 5/10.  Patient described pain is "aching".  No palliative measure for complaint.  Past Medical History:  Diagnosis Date  . Depression   . Hyperlipemia     There are no active problems to display for this patient.   History reviewed. No pertinent surgical history.  Prior to Admission medications   Medication Sig Start Date End Date Taking? Authorizing Provider  chlorpheniramine-HYDROcodone (TUSSIONEX PENNKINETIC ER) 10-8 MG/5ML SUER Take 5 mLs by mouth every 12 (twelve) hours as needed for cough. 05/07/17   Tommi Rumps, PA-C  gentamicin (GARAMYCIN) 0.3 % ophthalmic solution Place 1 drop into the left eye every 4 (four) hours. 01/09/18   Joni Reining, PA-C  hydrocortisone 1 % lotion Apply 1 application topically 2 (two) times daily. 09/05/16   Little, Traci M, PA-C  ketorolac (TORADOL) 10 MG tablet Take 1 tablet (10 mg total) by mouth every 6 (six) hours as needed. 11/09/17   Enid Derry, PA-C    Allergies Patient has no known allergies.  No family history on file.  Social History Social History   Tobacco Use  . Smoking status: Current Some Day Smoker  . Smokeless tobacco: Never Used  Substance Use Topics  . Alcohol use: No  . Drug use: No    Review of Systems Constitutional: No fever/chills Eyes: No visual changes.  Foreign body sensation left eye. ENT: No  sore throat. Cardiovascular: Denies chest pain. Respiratory: Denies shortness of breath. Gastrointestinal: No abdominal pain.  No nausea, no vomiting.  No diarrhea.  No constipation. Genitourinary: Negative for dysuria. Musculoskeletal: Negative for back pain. Skin: Negative for rash. Neurological: Negative for headaches, focal weakness or numbness.   ____________________________________________   PHYSICAL EXAM:  VITAL SIGNS: ED Triage Vitals [01/09/18 1147]  Enc Vitals Group     BP      Pulse      Resp      Temp      Temp src      SpO2      Weight 242 lb 1 oz (109.8 kg)     Height      Head Circumference      Peak Flow      Pain Score 5     Pain Loc      Pain Edu?      Excl. in GC?    Constitutional: Alert and oriented. Well appearing and in no acute distress. Eyes: Conjunctivae are normal. PERRL. EOMI. fluoroscopy stain reveals corneal abrasion but no obvious foreign body. Cardiovascular: Normal rate, regular rhythm. Grossly normal heart sounds.  Good peripheral circulation. Respiratory: Normal respiratory effort.  No retractions. Lungs CTAB. Neurologic:  Normal speech and language. No gross focal neurologic deficits are appreciated. No gait instability. Skin:  Skin is warm, dry and intact. No rash noted. Psychiatric: Mood and affect are normal. Speech and behavior are normal.  ____________________________________________   LABS (all labs ordered are listed, but only abnormal results are displayed)  Labs Reviewed - No data to display ____________________________________________  EKG   ____________________________________________  RADIOLOGY  ED MD interpretation:    Official radiology report(s): No results found.  ____________________________________________   PROCEDURES  Procedure(s) performed: None  Procedures  Critical Care performed: No  ____________________________________________   INITIAL IMPRESSION / ASSESSMENT AND PLAN / ED  COURSE  As part of my medical decision making, I reviewed the following data within the electronic MEDICAL RECORD NUMBER    Patient presents with foreign body sensation left eye secondary corneal abrasion.  Advised patient no foreign body was seen on fluoroscopic exam.  Patient given discharge care instruction advised use eyedrops as directed.  Patient advised to follow-up with ophthalmology if no improvement in 2 days.     ____________________________________________   FINAL CLINICAL IMPRESSION(S) / ED DIAGNOSES  Final diagnoses:  Abrasion of left cornea, initial encounter     ED Discharge Orders         Ordered    gentamicin (GARAMYCIN) 0.3 % ophthalmic solution  Every 4 hours     01/09/18 1217           Note:  This document was prepared using Dragon voice recognition software and may include unintentional dictation errors.    Joni Reining, PA-C 01/09/18 1224    Charlynne Pander, MD 01/10/18 [phone]

## 2018-01-09 NOTE — ED Notes (Signed)
See triage note  Presents with irritation to left eye about 2 days ago states it feels like he has gotten something in eye

## 2018-01-09 NOTE — ED Triage Notes (Signed)
C/O feeling like something is in left eye x 2 days.  States flushed eye out with water, no improvement.

## 2018-04-01 ENCOUNTER — Emergency Department
Admission: EM | Admit: 2018-04-01 | Discharge: 2018-04-01 | Discharge status: Against Medical Advice | Payer: Non-veteran care | Attending: Emergency Medicine | Admitting: Emergency Medicine

## 2018-04-01 DIAGNOSIS — R0602 Shortness of breath: Secondary | ICD-10-CM | POA: Diagnosis not present

## 2018-04-01 DIAGNOSIS — Z5321 Procedure and treatment not carried out due to patient leaving prior to being seen by health care provider: Secondary | ICD-10-CM | POA: Diagnosis not present

## 2018-04-01 NOTE — ED Triage Notes (Signed)
First Nurse Note:  Patietn c/o sob.  Lungs CTA,  Vital Signs checked.  SpO2 98% on RA.  No SOB/ DOE.

## 2019-05-03 IMAGING — CR DG KNEE COMPLETE 4+V*R*
1 series · 4 of 4 positions shown · non-contrast
Comparison: None.

CLINICAL DATA: Right knee pain.

EXAM:
RIGHT KNEE - COMPLETE 4+ VIEW

[Series 1: dg knee complete 4 views right · 0.14mm/px · 4 of 4 slices shown]
[im 1/4]
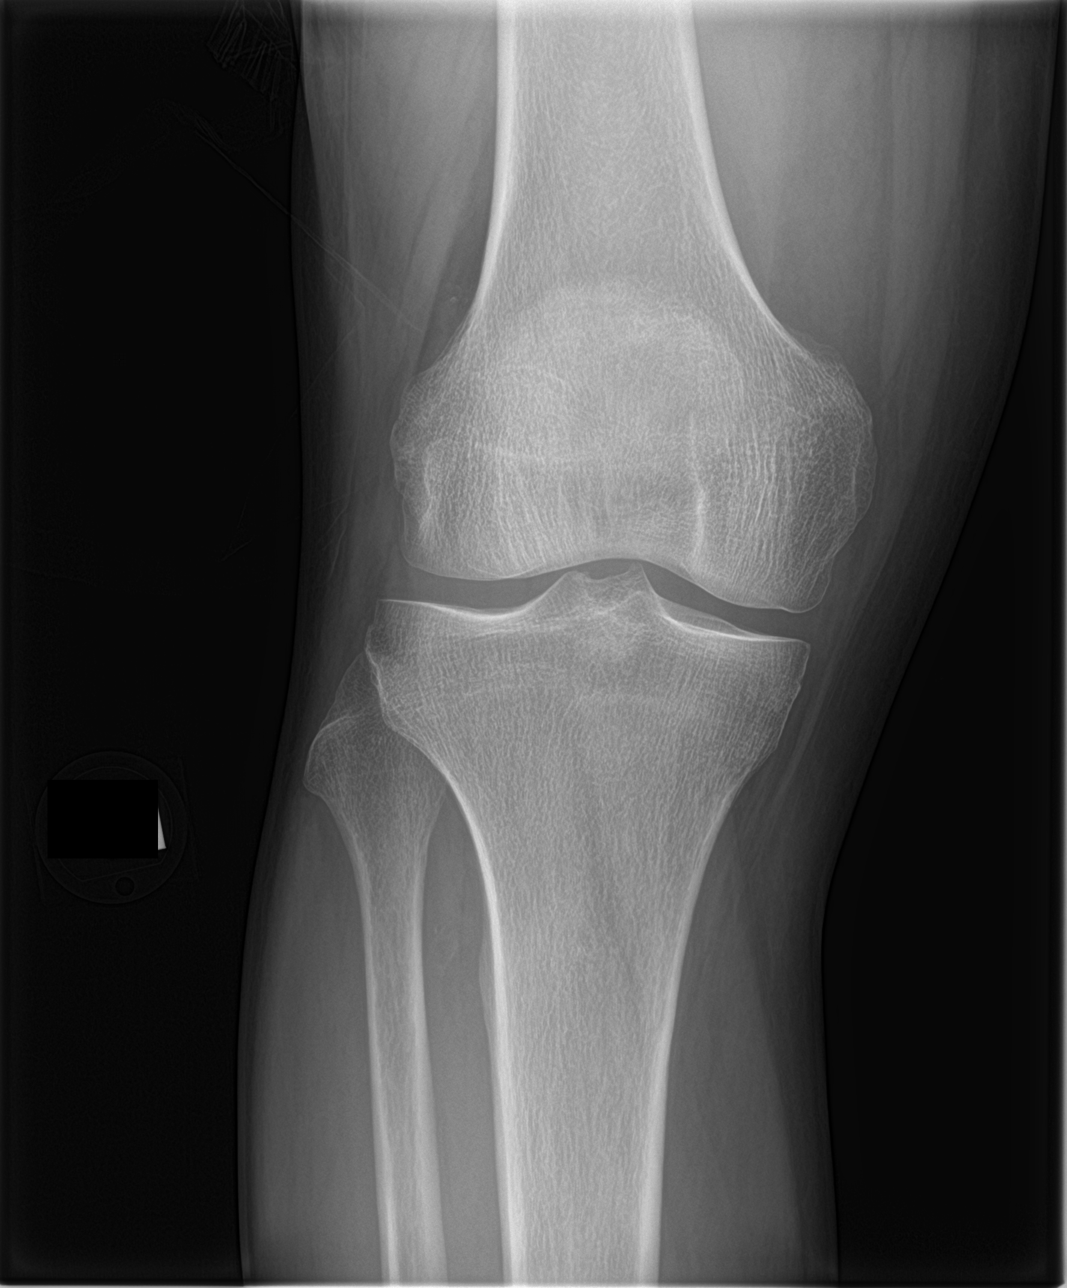
[im 2/4]
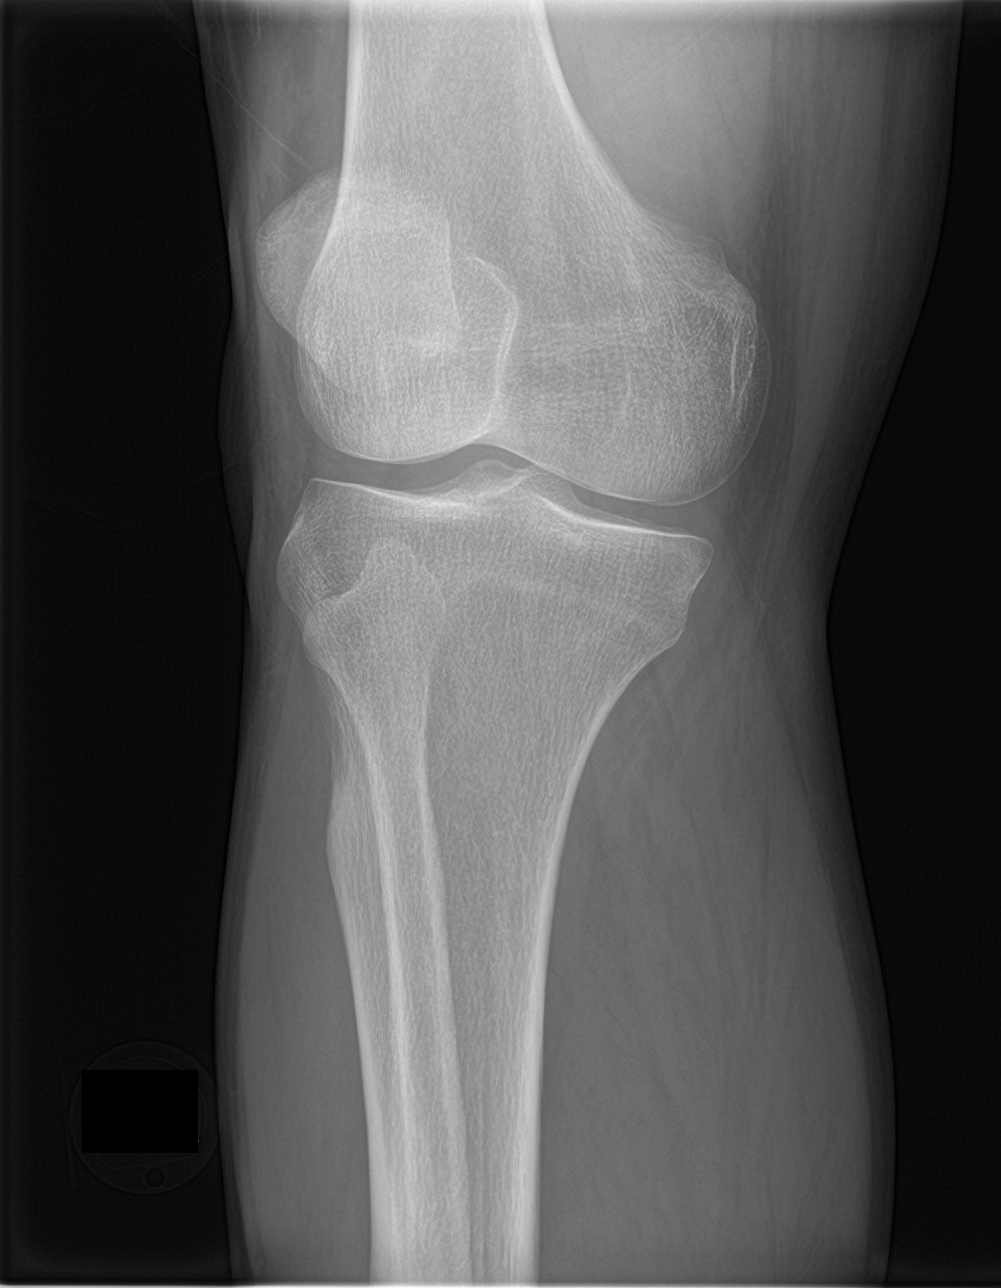
[im 3/4]
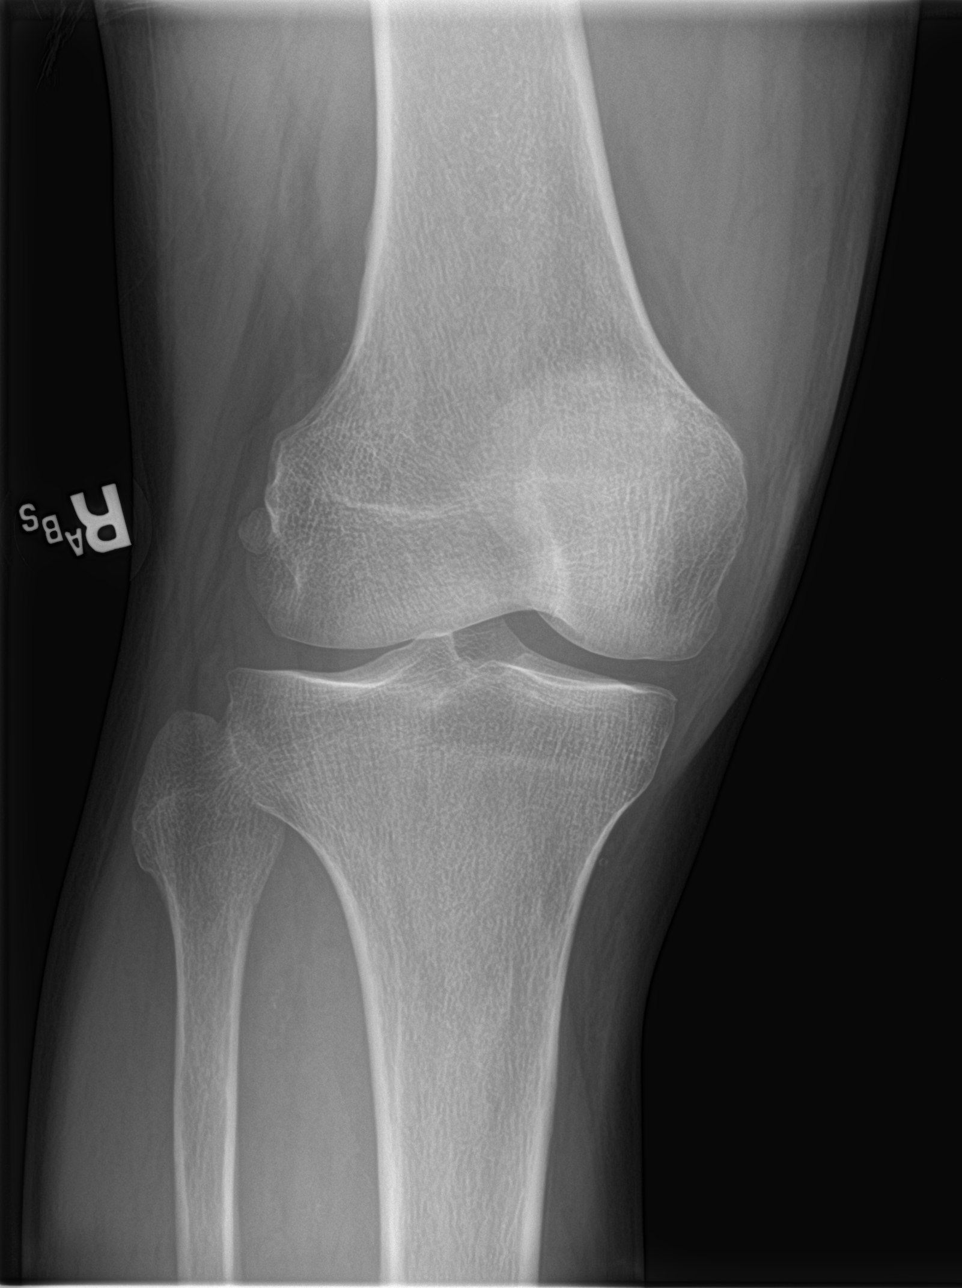
[im 4/4]
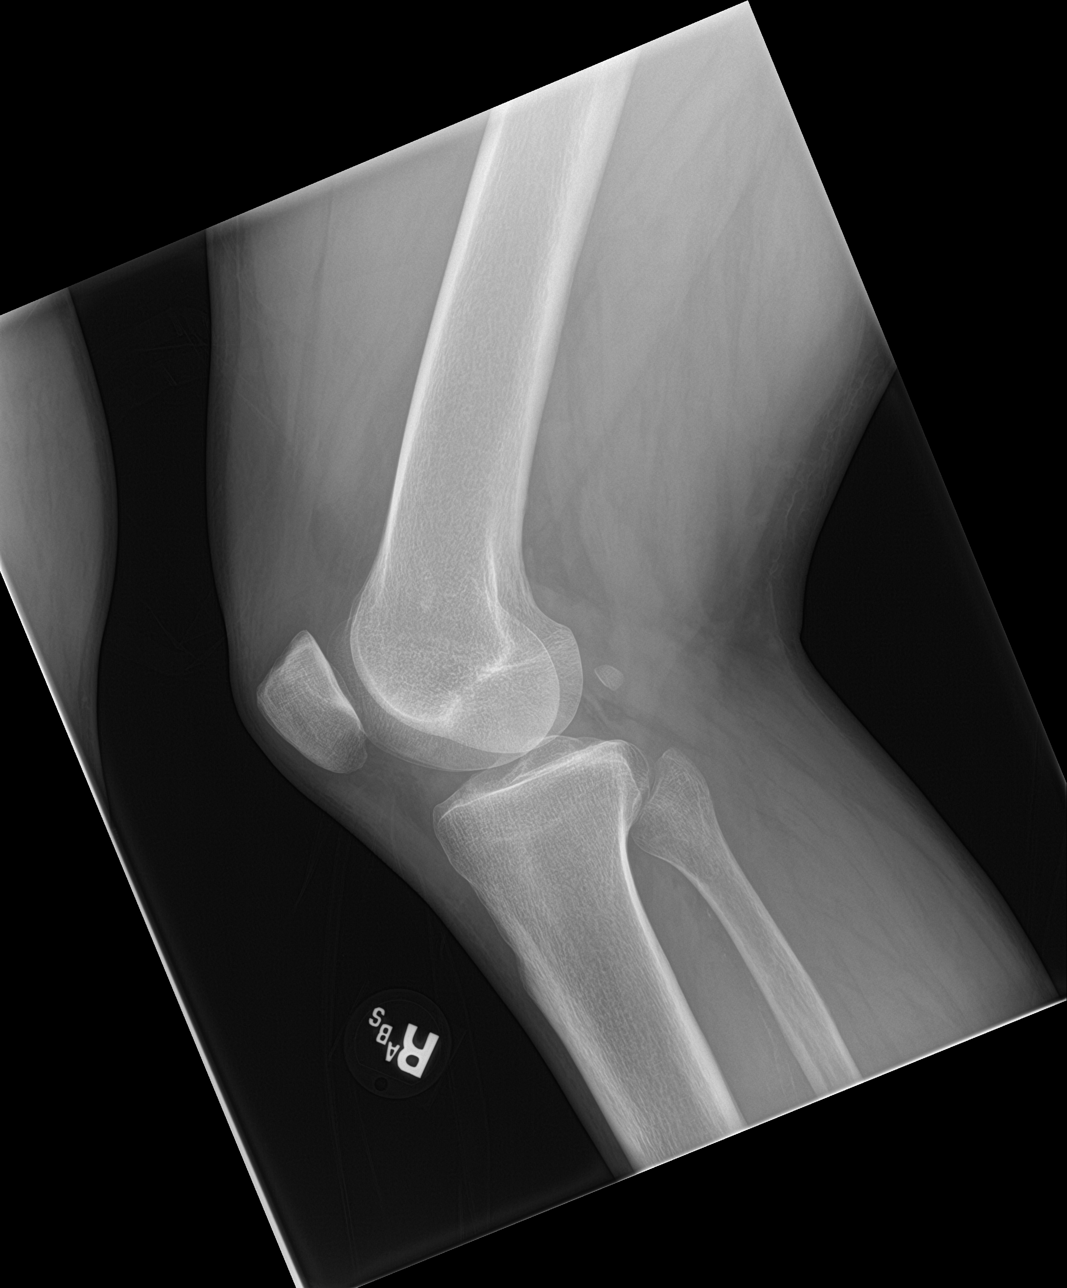

[4 of 4 positions shown; findings below may reference images not displayed]

FINDINGS: No evidence of fracture, dislocation, or joint effusion. No evidence
of arthropathy or other focal bone abnormality. Soft tissues are
unremarkable.
IMPRESSION: Normal right knee.

## 2021-01-13 ENCOUNTER — Other Ambulatory Visit: Payer: Self-pay

## 2021-01-13 ENCOUNTER — Encounter: Payer: Self-pay | Admitting: Emergency Medicine

## 2021-01-13 ENCOUNTER — Emergency Department
Admission: EM | Admit: 2021-01-13 | Discharge: 2021-01-13 | Disposition: A | Discharge status: Home / Self Care | Payer: No Typology Code available for payment source | Attending: Emergency Medicine | Admitting: Emergency Medicine

## 2021-01-13 DIAGNOSIS — R109 Unspecified abdominal pain: Secondary | ICD-10-CM | POA: Insufficient documentation

## 2021-01-13 DIAGNOSIS — R21 Rash and other nonspecific skin eruption: Secondary | ICD-10-CM | POA: Diagnosis not present

## 2021-01-13 DIAGNOSIS — X58XXXA Exposure to other specified factors, initial encounter: Secondary | ICD-10-CM | POA: Diagnosis not present

## 2021-01-13 DIAGNOSIS — M79645 Pain in left finger(s): Secondary | ICD-10-CM | POA: Insufficient documentation

## 2021-01-13 DIAGNOSIS — S161XXA Strain of muscle, fascia and tendon at neck level, initial encounter: Secondary | ICD-10-CM | POA: Insufficient documentation

## 2021-01-13 DIAGNOSIS — F17200 Nicotine dependence, unspecified, uncomplicated: Secondary | ICD-10-CM | POA: Diagnosis not present

## 2021-01-13 DIAGNOSIS — S199XXA Unspecified injury of neck, initial encounter: Secondary | ICD-10-CM | POA: Diagnosis present

## 2021-01-13 MED ORDER — LIDOCAINE 5 % EX PTCH: 1.0000 | MEDICATED_PATCH | Freq: Two times a day (BID) | CUTANEOUS | 0 refills | Status: DC

## 2021-01-13 MED ORDER — NAPROXEN 500 MG PO TABS: 500.0000 mg | ORAL_TABLET | Freq: Two times a day (BID) | ORAL | 0 refills | Status: DC

## 2021-01-13 NOTE — ED Provider Notes (Signed)
[facility] Emergency Department Provider Note  ____________________________________________  Time seen: Approximately 12:01 PM  I have reviewed the triage vital signs and the nursing notes.   HISTORY  Chief Complaint Neck Pain, Hand Pain, and Groin Pain    HPI Bobby Zuniga is a 55 y.o. 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  No fevers chills headache vision changes paresthesias or motor weakness.  Its been there for about a month.  Its intermittent.  Also complains of left thumb pain for the last week and notes that his thumb has a clicking sound whenever he moves it.  He has wrapped it and duct tape to splint it which she says helps.  It is nonradiating, no injuries or wounds.  Also complains of an irritated spot on his right upper arm which he thinks is from a bee sting 2 days ago.  Also complains of a rash on the left side of his chest that has been present for a month.    Past Medical History:  Diagnosis Date   Depression    Hyperlipemia      There are no problems to display for this patient.    History reviewed. No pertinent surgical history.   Prior to Admission medications   Medication Sig Start Date End Date Taking? Authorizing Provider  lidocaine (LIDODERM) 5 % Place 1 patch onto the skin every 12 (twelve) hours. Remove & Discard patch within 12 hours or as directed by MD 01/13/21  Yes Sharman Cheek, MD  naproxen (NAPROSYN) 500 MG tablet Take 1 tablet (500 mg total) by mouth 2 (two) times daily with a meal. 01/13/21  Yes Sharman Cheek, MD  chlorpheniramine-HYDROcodone [facility] ER) 10-8 MG/5ML SUER Take 5 mLs by mouth every 12 (twelve) hours as needed for cough. 05/07/17   Tommi Rumps, PA-C  gentamicin (GARAMYCIN) 0.3 % ophthalmic solution Place 1 drop into the left eye every  4 (four) hours. 01/09/18   Joni Reining, PA-C  hydrocortisone 1 % lotion Apply 1 application topically 2 (two) times daily. 09/05/16   Little, Traci M, PA-C  ketorolac (TORADOL) 10 MG tablet Take 1 tablet (10 mg total) by mouth every 6 (six) hours as needed. 11/09/17   Enid Derry, PA-C     Allergies Patient has no known allergies.   History reviewed. No pertinent family history.  Social History Social History   Tobacco Use   Smoking status: Some Days   Smokeless tobacco: Never  Substance Use Topics   Alcohol use: No   Drug use: No    Review of Systems  Constitutional:   No fever or chills.  ENT:   No sore throat. No rhinorrhea. Cardiovascular:   No chest pain or syncope. Respiratory:   No dyspnea or cough. Gastrointestinal:   Negative for abdominal pain, vomiting and diarrhea.  Musculoskeletal:   Multiple complaints as above All other systems reviewed and are negative except as documented above in ROS and HPI.  ____________________________________________   PHYSICAL EXAM:  VITAL SIGNS: ED Triage Vitals  Enc Vitals Group     BP 01/13/21 0945 (!) 148/87     Pulse Rate 01/13/21 0945 84     Resp 01/13/21 0945 18     Temp 01/13/21 0945 98.4 F (36.9 C)     Temp Source 01/13/21 0945 Oral     SpO2 01/13/21 0945 97 %  Weight 01/13/21 0943 242 lb 1 oz (109.8 kg)     Height 01/13/21 0943 6' (1.829 m)     Head Circumference --      Peak Flow --      Pain Score 01/13/21 0943 9     Pain Loc --      Pain Edu? --      Excl. in GC? --     Vital signs reviewed, nursing assessments reviewed.   Constitutional:   Alert and oriented. Non-toxic appearance. Eyes:   Conjunctivae are normal. EOMI.  ENT      Head:   Normocephalic and atraumatic.      Nose:   Normal.      Mouth/Throat: Normal, moist mucosa      Neck:   No meningismus. Full ROM.  No midline spinal tenderness.  There is tenseness and tenderness which reproduces his pain at the left  trapezius. Hematological/Lymphatic/Immunilogical:   No cervical lymphadenopathy. Cardiovascular:   RRR. Symmetric bilateral radial and DP pulses.  No murmurs. Cap refill less than 2 seconds. Respiratory:   Normal respiratory effort without tachypnea/retractions. Breath sounds are clear and equal bilaterally. No wheezes/rales/rhonchi. Musculoskeletal:   Normal range of motion in all extremities. No joint effusions.  No lower extremity tenderness.  No edema.  Left hand and thumb are normal.  After unwrapping the duct tape, there are no wounds no inflammatory changes.  No bony point tenderness.  Joints are stable.  Right upper arm has a small less than 1 cm area of faint erythema.  There appears to be a retained stinger fragment which was removed by me.  Left chest wall shows some mild skin abrasion.  Not consistent with infection, no laceration. Neurologic:   Normal speech and language.  Motor grossly intact. No acute focal neurologic deficits are appreciated.  Skin:    Skin is warm, dry with findings as above.  No pattern rash, no petechia purpura or bullae ____________________________________________    LABS (pertinent positives/negatives) (all labs ordered are listed, but only abnormal results are displayed) Labs Reviewed - No data to display ____________________________________________   EKG    ____________________________________________    RADIOLOGY  No results found.  ____________________________________________   PROCEDURES Procedures  ____________________________________________    CLINICAL IMPRESSION / ASSESSMENT AND PLAN / ED COURSE  Medications ordered in the ED: Medications - No data to display  Pertinent labs & imaging results that were available during my care of the patient were reviewed by me and considered in my medical decision making (see chart for details).  Bobby Zuniga was evaluated in Emergency Department on 01/13/2021 for the symptoms  described in the history of present illness. He was evaluated in the context of the global COVID-19 pandemic, which necessitated consideration that the patient might be at risk for infection with the SARS-CoV-2 virus that causes COVID-19. Institutional protocols and algorithms that pertain to the evaluation of patients at risk for COVID-19 are in a state of rapid change based on information released by regulatory bodies including the CDC and federal and state organizations. These policies and algorithms were followed during the patient's care in the ED.   Patient presents with multiple musculoskeletal complaints.  No signs of infection.  Doubt fracture or meningitis encephalitis or vascular injury.  She is stable for discharge to take NSAIDs, heat therapy.  Recommend close follow-up with his PCP at the [place].  Recommend physical therapy for his left hand and avoiding prolonged splinting.      ____________________________________________  FINAL CLINICAL IMPRESSION(S) / ED DIAGNOSES    Final diagnoses:  Strain of neck muscle, initial encounter  Thumb pain, left     ED Discharge Orders          Ordered    naproxen (NAPROSYN) 500 MG tablet  2 times daily with meals        01/13/21 1201    lidocaine (LIDODERM) 5 %  Every 12 hours        01/13/21 1201            Portions of this note were generated with dragon dictation software. Dictation errors may occur despite best attempts at proofreading.    Sharman Cheek, MD 01/13/21 [phone]

## 2021-01-13 NOTE — ED Triage Notes (Signed)
Pt comes into the ED via POV c/o neck pain that has been ongoing for weeks.  Pt states he thought it was related to sleeping wrong.  Pt also admits to his right thumb pain.  Pt denies any injury to the finger.  Pt also states he has right groin pain.  Pt denies any bulge in the area and denies any injury or problems with urination.  PT explains it just hurts when walking.  PT in NAD at this time with even and unlabored respirations and good gait.

## 2021-07-26 ENCOUNTER — Other Ambulatory Visit: Payer: Self-pay

## 2021-07-26 ENCOUNTER — Emergency Department
Admission: EM | Admit: 2021-07-26 | Discharge: 2021-07-26 | Disposition: A | Discharge status: Home / Self Care | Payer: No Typology Code available for payment source | Attending: Student in an Organized Health Care Education/Training Program | Admitting: Student in an Organized Health Care Education/Training Program

## 2021-07-26 DIAGNOSIS — M79602 Pain in left arm: Secondary | ICD-10-CM | POA: Diagnosis not present

## 2021-07-26 DIAGNOSIS — M25512 Pain in left shoulder: Secondary | ICD-10-CM | POA: Diagnosis present

## 2021-07-26 NOTE — ED Triage Notes (Signed)
Pt states he was lifting wts on a bench yesterday and then later repotting a plant and felt something pop in her left shoulder and has been having pain since. States he is weak on the left side from previous head injury and struggling when he lifts wts and his spotter yesterday was not very helpful ?

## 2021-07-26 NOTE — ED Provider Notes (Signed)
? ?[facility] ?Provider Note ? ? ? Event Date/Time  ? First MD Initiated Contact with Patient 07/26/21 1033   ?  (approximate) ? ? ?History  ? ?Arm Pain ? ? ?HPI ? ?Bobby Zuniga is a 56 y.o. male   presents to the ER for evaluation of 1 week of left shoulder pain.  Patient injured his shoulder while bench pressing.  States that when this bothers did a bad job helping as he was lifting 280 pounds.  Felt tearing pain in his left shoulder and has been had some achiness since then then yesterday he was repot in a large plant when lifting a pot felt a popping sensation.  Denies any numbness or tingling.  No chest pain or pressure.  Was concerned because he feels like typically after he overdoes it lifting he feels better within 2 to 3 days but is taking longer to heal at this time. ? ?  ? ? ?Physical Exam  ? ?Triage Vital Signs: ?ED Triage Vitals  ?Enc Vitals Group  ?   BP 07/26/21 1041 140/79  ?   Pulse Rate 07/26/21 1041 73  ?   Resp 07/26/21 1041 18  ?   Temp 07/26/21 1041 98.1 ?F (36.7 ?C)  ?   Temp Source 07/26/21 1041 Oral  ?   SpO2 07/26/21 1041 97 %  ?   Weight 07/26/21 1033 242 lb 1 oz (109.8 kg)  ?   Height 07/26/21 1033 6' (1.829 m)  ?   Head Circumference --   ?   Peak Flow --   ?   Pain Score --   ?   Pain Loc --   ?   Pain Edu? --   ?   Excl. in [place]? --   ? ? ?Most recent vital signs: ?Vitals:  ? 07/26/21 1041  ?BP: 140/79  ?Pulse: 73  ?Resp: 18  ?Temp: 98.1 ?F (36.7 ?C)  ?SpO2: 97%  ? ? ? ?Constitutional: Alert  ?Eyes: Conjunctivae are normal.  ?Head: Atraumatic. ?Nose: No congestion/rhinnorhea. ?Mouth/Throat: Mucous membranes are moist.   ?Neck: Painless ROM.  ?Cardiovascular:   Good peripheral circulation. ?Respiratory: Normal respiratory effort.  No retractions.  ?Gastrointestinal: Soft and nontender.  ?Musculoskeletal:  no deformity bilateral shoulders.  Neurovascular intact distally.  No pain at the elbow.  Pain with abduction and of the left arm and engagement of the left  pectoralis muscle.  No posterior tenderness to palpation. ?Neurologic:  MAE spontaneously. No gross focal neurologic deficits are appreciated.  ?Skin:  Skin is warm, dry and intact. No rash noted. ?Psychiatric: Mood and affect are normal. Speech and behavior are normal. ? ? ? ?ED Results / Procedures / Treatments  ? ?Labs ?(all labs ordered are listed, but only abnormal results are displayed) ?Labs Reviewed - No data to display ? ? ?EKG ? ? ? ? ?RADIOLOGY ? ? ? ?PROCEDURES: ? ?Critical Care performed:  ? ?Procedures ? ? ?MEDICATIONS ORDERED IN ED: ?Medications - No data to display ? ? ?IMPRESSION / MDM / ASSESSMENT AND PLAN / ED COURSE  ?I reviewed the triage vital signs and the nursing notes. ?             ?               ? ?Differential diagnosis includes, but is not limited to, fracture, contusion, strain, dislocation, ligamentous injury ? ? ?Patient presenting with left shoulder pain for the past week as described above.  Clinically does  not appear consistent with dislocation is able to fully range shoulder.  The patient is having reproducible pain anterior left shoulder consistent with musculoskeletal injury.  I recommended x-ray to evaluate for possible voyage and fracture arthritis or other acute abnormality.  Patient declined any imaging..  States he prefer to follow-up with PCP.  Denies any other complaints.  I suspect muscular strain.  We discussed strict return precautions conservative management. ? ? ?  ? ? ?FINAL CLINICAL IMPRESSION(S) / ED DIAGNOSES  ? ?Final diagnoses:  ?Acute pain of left shoulder  ? ? ? ?Rx / DC Orders  ? ?ED Discharge Orders   ? ? None  ? ?  ? ? ? ?Note:  This document was prepared using Dragon voice recognition software and may include unintentional dictation errors. ? ?  ?Merlyn Lot, MD ?07/26/21 1048 ? ?

## 2021-07-26 NOTE — ED Notes (Signed)
See triage note  presents with pain to left shoulder/arm  states he was lifting at the gym he felt a pop  he also moved something heavy  no deformity noted  good pulses  grip is slightly weaker d/t previous head injury ?

## 2023-07-02 ENCOUNTER — Ambulatory Visit
Admission: EM | Admit: 2023-07-02 | Discharge: 2023-07-02 | Disposition: A | Discharge status: Home / Self Care | Attending: Family Medicine | Admitting: Family Medicine

## 2023-07-02 DIAGNOSIS — J45909 Unspecified asthma, uncomplicated: Secondary | ICD-10-CM | POA: Insufficient documentation

## 2023-07-02 DIAGNOSIS — B009 Herpesviral infection, unspecified: Secondary | ICD-10-CM | POA: Insufficient documentation

## 2023-07-02 DIAGNOSIS — R071 Chest pain on breathing: Secondary | ICD-10-CM | POA: Insufficient documentation

## 2023-07-02 DIAGNOSIS — R61 Generalized hyperhidrosis: Secondary | ICD-10-CM | POA: Insufficient documentation

## 2023-07-02 DIAGNOSIS — A6000 Herpesviral infection of urogenital system, unspecified: Secondary | ICD-10-CM | POA: Insufficient documentation

## 2023-07-02 DIAGNOSIS — G8190 Hemiplegia, unspecified affecting unspecified side: Secondary | ICD-10-CM | POA: Insufficient documentation

## 2023-07-02 DIAGNOSIS — R030 Elevated blood-pressure reading, without diagnosis of hypertension: Secondary | ICD-10-CM | POA: Insufficient documentation

## 2023-07-02 DIAGNOSIS — R0789 Other chest pain: Secondary | ICD-10-CM | POA: Diagnosis not present

## 2023-07-02 DIAGNOSIS — F0631 Mood disorder due to known physiological condition with depressive features: Secondary | ICD-10-CM | POA: Insufficient documentation

## 2023-07-02 DIAGNOSIS — J4520 Mild intermittent asthma, uncomplicated: Secondary | ICD-10-CM | POA: Insufficient documentation

## 2023-07-02 DIAGNOSIS — E669 Obesity, unspecified: Secondary | ICD-10-CM | POA: Insufficient documentation

## 2023-07-02 DIAGNOSIS — F0630 Mood disorder due to known physiological condition, unspecified: Secondary | ICD-10-CM | POA: Insufficient documentation

## 2023-07-02 DIAGNOSIS — H8110 Benign paroxysmal vertigo, unspecified ear: Secondary | ICD-10-CM | POA: Insufficient documentation

## 2023-07-02 DIAGNOSIS — H519 Unspecified disorder of binocular movement: Secondary | ICD-10-CM | POA: Insufficient documentation

## 2023-07-02 DIAGNOSIS — T1500XA Foreign body in cornea, unspecified eye, initial encounter: Secondary | ICD-10-CM | POA: Insufficient documentation

## 2023-07-02 DIAGNOSIS — F05 Delirium due to known physiological condition: Secondary | ICD-10-CM | POA: Insufficient documentation

## 2023-07-02 DIAGNOSIS — M549 Dorsalgia, unspecified: Secondary | ICD-10-CM | POA: Diagnosis not present

## 2023-07-02 DIAGNOSIS — F652 Exhibitionism: Secondary | ICD-10-CM | POA: Insufficient documentation

## 2023-07-02 DIAGNOSIS — Z23 Encounter for immunization: Secondary | ICD-10-CM | POA: Insufficient documentation

## 2023-07-02 DIAGNOSIS — M7542 Impingement syndrome of left shoulder: Secondary | ICD-10-CM | POA: Insufficient documentation

## 2023-07-02 DIAGNOSIS — Z87820 Personal history of traumatic brain injury: Secondary | ICD-10-CM | POA: Insufficient documentation

## 2023-07-02 DIAGNOSIS — S058X9A Other injuries of unspecified eye and orbit, initial encounter: Secondary | ICD-10-CM | POA: Insufficient documentation

## 2023-07-02 DIAGNOSIS — M545 Low back pain, unspecified: Secondary | ICD-10-CM | POA: Insufficient documentation

## 2023-07-02 MED ORDER — DEXAMETHASONE SODIUM PHOSPHATE 10 MG/ML IJ SOLN
10.0000 mg | Freq: Once | INTRAMUSCULAR | Status: AC
  Administered 2023-07-02: 10 mg via INTRAMUSCULAR

## 2023-07-02 MED ORDER — CYCLOBENZAPRINE HCL 10 MG PO TABS: 10.0000 mg | ORAL_TABLET | Freq: Two times a day (BID) | ORAL | 0 refills | Status: DC | PRN

## 2023-07-02 MED ORDER — PREDNISONE 10 MG (21) PO TBPK: ORAL_TABLET | Freq: Every day | ORAL | 0 refills | Status: DC

## 2023-07-02 MED ORDER — KETOROLAC TROMETHAMINE 60 MG/2ML IM SOLN
30.0000 mg | Freq: Once | INTRAMUSCULAR | Status: AC
  Administered 2023-07-02: 30 mg via INTRAMUSCULAR

## 2023-07-02 NOTE — ED Provider Notes (Signed)
 [facility]    CSN: [id number] Arrival date & time: 07/02/23  1420      History   Chief Complaint Chief Complaint  Patient presents with   Back Pain    HPI  HPI Bobby Zuniga is a 58 y.o. male.   Bobby Zuniga presents for low back pain that started last week while on the treadmill. He pulled down and felt immediate pain in his lower back. He continues to go to the gym but the pain got so bad yesterday he wasn't able to work out.  He pushes between 130- 225 lbs. Tried ibuprofen but its not helping.      days ago after ***?injury. Pain is described as *** {back pain desc:31852} and {back pain radiation:11559}. Pain rated ***/10.  Endorses {ed back pain sx:10963}. Denies {ed back pain sx:10963}.   Continues to have *** description pain with movement. Bobby Zuniga ***does not feel like his*** legs are weak.   Has ***never injured his/her*** back before.   Has tried {home care:60200} with {relief:12621}.  ***No change in pain day vs night.    Red flags*** Perianal numbness, cancer, unexplained weight loss, immunosuppression, prolonged use of steroids, history of IV drug use, pain that is increased or unrelieved by rest, fever, bladder or bowel incontinence, significant trauma related to age   Fever : no  Weight loss: *** Perianal numbness: *** Bowel incontinence: *** Bladder incontinence: *** Trauma: ***  Sore throat: no   Cough: no Hydration: normal  Abdominal pain: no Nausea: no Vomiting: no Abdominal pain: *** Dysuria:*** Sleep disturbance: no *** Neck Pain: no Headache: no     Past Medical History:  Diagnosis Date   Depression    Hyperlipemia     Patient Active Problem List   Diagnosis Date Noted   Asthma 07/02/2023   Benign paroxysmal positional vertigo 07/02/2023   Hemiplegia (HCC) 07/02/2023   Delirium due to general medical condition 07/02/2023   Disorder of eye movements 07/02/2023   Elevated blood-pressure reading without diagnosis of  hypertension 07/02/2023   Encounter for immunization 07/02/2023   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 No pertinent surgical history.     Home Medications    Prior to Admission medications   Medication Sig Start Date End Date Taking? Authorizing Provider  albuterol (VENTOLIN HFA) 108 (90 Base) MCG/ACT inhaler INHALE 2 PUFFS BY ORAL INHALATION FOUR TIMES A DAY AS NEEDED FOR SHORTNESS OF BREATH/WHEEZING 07/26/22  Yes [provider]  atorvastatin (LIPITOR) 20 MG tablet TAKE ONE-HALF TABLET BY MOUTH AT BEDTIME FOR CHOLESTEROL 07/26/22  Yes [provider]  buPROPion (WELLBUTRIN XL) 150 MG 24 hr tablet TAKE ONE TAB BY MOUTH EVERY 24 HOURS FOR MOOD 11/27/22  Yes [provider]  ibuprofen (ADVIL) 600 MG tablet TAKE ONE TABLET BY MOUTH ONCE EVERY DAY AS NEEDED FOR PAIN AND INFLAMMATION WITH FOOD AS NEEDED FOR PAIN AND INFLAMMATION. 11/02/22  Yes [provider]  venlafaxine XR (EFFEXOR-XR) 150 MG 24  hr capsule TAKE ONE CAPSULE BY MOUTH EVERY DAY MOOD 08/16/22  Yes [provider]    Family History History reviewed. No pertinent family history.  Social History Social History   Tobacco Use   Smoking status: Some Days   Smokeless tobacco: Never  Substance Use Topics   Alcohol use: No   Drug use: No     Allergies   Patient has no known allergies.   Review of  Systems Review of Systems: egative unless otherwise stated in HPI.      Physical Exam Triage Vital Signs ED Triage Vitals  Encounter Vitals Group     BP 07/02/23 1502 (!) 148/90     Systolic BP Percentile --      Diastolic BP Percentile --      Pulse Rate 07/02/23 1502 65     Resp 07/02/23 1502 16     Temp 07/02/23 1502 98 F (36.7 C)     Temp Source 07/02/23 1502 Oral     SpO2 07/02/23 1502 97 %     Weight 07/02/23 1501 245 lb (111.1 kg)     Height 07/02/23 1501 5\' 11"  (1.803 m)     Head Circumference --      Peak Flow --      Pain Score 07/02/23 1505 10     Pain Loc --      Pain Education --      Exclude from Growth Chart --    No data found.  Updated Vital Signs BP (!) 148/90 (BP Location: Left Arm)   Pulse 65   Temp 98 F (36.7 C) (Oral)   Resp 16   Ht 5\' 11"  (1.803 m)   Wt 111.1 kg   SpO2 97%   BMI 34.17 kg/m   Visual Acuity Right Eye Distance:   Left Eye Distance:   Bilateral Distance:    Right Eye Near:   Left Eye Near:    Bilateral Near:     Physical Exam GEN: well appearing male in no acute distress  CVS: well perfused  RESP: speaking in full sentences without pause, no respiratory distress  MSK:  Lumbar spine: - Inspection: no gross deformity or asymmetry, swelling or ecchymosis. No skin changes  - Palpation: No ***TTP over the spinous processes, ***bilateral lumbar paraspinal muscles, no SI joint tenderness bilaterally - ROM: full active ROM of the lumbar spine in flexion and extension but with mild pain ***with flexion/extension  - Strength: 5/5 strength of lower extremity in L4-S1 nerve root distributions b/l - Neuro: sensation intact in the L4-S1 nerve root distribution b/l, ***2+ L4 and S1 reflexes - Special testing: Negative straight leg raise SKIN: warm, dry, no overly skin rash or erythema    UC Treatments / Results  Labs (all labs ordered are listed, but only abnormal results are displayed) Labs Reviewed - No data to  display  EKG   Radiology No results found.   Procedures Procedures (including critical care time)  Medications Ordered in UC Medications - No data to display  Initial Impression / Assessment and Plan / UC Course  I have reviewed the triage vital signs and the nursing notes.  Pertinent labs & imaging results that were available during my care of the patient were reviewed by me and considered in my medical decision making (see chart for details).      Pt is a 58 y.o.  male with *** days of *** back pain after ***.  Has history of ***low back  pain.   Obtained ***lumbar plain films.  Xray personally interpreted by me were ***unremarkable for fracture, ***or significant malalignment.  Radiologist report reviewed and notes ***  Patient to gradually return to normal activities, as tolerated and continue ordinary activities within the limits permitted by pain. Prescribed Naproxen sodium *** and muscle relaxer *** for pain relief.  Advised patient to avoid other NSAIDs while taking ***Naprosyn. Tylenol and Lidocaine patches PRN for multimodal pain relief. Counseled patient on red flag symptoms and when to seek immediate care.  ***No red flags suggesting cauda equina syndrome or progressive major motor weakness. Patient to follow up with orthopedic provider if symptoms do not improve with conservative treatment.  Return and ED precautions given.    Discussed MDM, treatment plan and plan for follow-up with patient who agrees with plan.   Final Clinical Impressions(s) / UC Diagnoses   Final diagnoses:  None   Discharge Instructions   None    ED Prescriptions   None    PDMP not reviewed this encounter.

## 2023-07-02 NOTE — ED Triage Notes (Signed)
 Pt c/o lower back pain x1 wk. Denies any falls,injuries or heavy lifting.

## 2023-07-02 NOTE — Discharge Instructions (Addendum)
 Go to the hospital emergency department if your chest pain doesn't improve or your back pain worsens. As your EKG was abnormal, do not ignore chest pain.  Avoid lifting at the gym for the next 1-2 weeks to prevent further injury.  Stretching will help.  Follow up with orthopedic provider if not improving.  Stop by the pharmacy to pick up your prescriptions.  Follow up with your primary care provider or return to the urgent care, if not improving.

## 2023-07-11 ENCOUNTER — Other Ambulatory Visit: Payer: Self-pay

## 2023-07-11 ENCOUNTER — Emergency Department

## 2023-07-11 ENCOUNTER — Emergency Department
Admission: EM | Admit: 2023-07-11 | Discharge: 2023-07-11 | Disposition: A | Discharge status: Home / Self Care | Attending: Emergency Medicine | Admitting: Emergency Medicine

## 2023-07-11 DIAGNOSIS — J45909 Unspecified asthma, uncomplicated: Secondary | ICD-10-CM | POA: Diagnosis not present

## 2023-07-11 DIAGNOSIS — R0609 Other forms of dyspnea: Secondary | ICD-10-CM

## 2023-07-11 DIAGNOSIS — Z7951 Long term (current) use of inhaled steroids: Secondary | ICD-10-CM | POA: Diagnosis not present

## 2023-07-11 DIAGNOSIS — R55 Syncope and collapse: Secondary | ICD-10-CM | POA: Diagnosis present

## 2023-07-11 DIAGNOSIS — R0600 Dyspnea, unspecified: Secondary | ICD-10-CM | POA: Insufficient documentation

## 2023-07-11 LAB — COMPREHENSIVE METABOLIC PANEL WITH GFR
ALT: 15 U/L (ref 0–44)
AST: 21 U/L (ref 15–41)
Albumin: 3.6 g/dL (ref 3.5–5.0)
Alkaline Phosphatase: 51 U/L (ref 38–126)
Anion gap: 7 (ref 5–15)
BUN: 47 mg/dL — ABNORMAL HIGH (ref 6–20)
CO2: 25 mmol/L (ref 22–32)
Calcium: 8.9 mg/dL (ref 8.9–10.3)
Chloride: 106 mmol/L (ref 98–111)
Creatinine, Ser: 1.23 mg/dL (ref 0.61–1.24)
GFR, Estimated: >60 mL/min (ref >60)
Glucose, Bld: 147 mg/dL — ABNORMAL HIGH (ref 70–99)
Potassium: 4.1 mmol/L (ref 3.5–5.1)
Sodium: 138 mmol/L (ref 135–145)
Total Bilirubin: 0.5 mg/dL (ref 0.0–1.2)
Total Protein: 6.5 g/dL (ref 6.5–8.1)

## 2023-07-11 LAB — CBC WITH DIFFERENTIAL/PLATELET
Abs Immature Granulocytes: 0.48 10*3/uL — ABNORMAL HIGH (ref 0.00–0.07)
Basophils Absolute: 0.1 10*3/uL (ref 0.0–0.1)
Basophils Relative: 1 %
Eosinophils Absolute: 0.3 10*3/uL (ref 0.0–0.5)
Eosinophils Relative: 2 %
HCT: 28.2 % — ABNORMAL LOW (ref 39.0–52.0)
Hemoglobin: 9.5 g/dL — ABNORMAL LOW (ref 13.0–17.0)
Immature Granulocytes: 4 %
Lymphocytes Relative: 23 %
Lymphs Abs: 2.7 10*3/uL (ref 0.7–4.0)
MCH: 29.7 pg (ref 26.0–34.0)
MCHC: 33.7 g/dL (ref 30.0–36.0)
MCV: 88.1 fL (ref 80.0–100.0)
Monocytes Absolute: 0.9 10*3/uL (ref 0.1–1.0)
Monocytes Relative: 7 %
Neutro Abs: 7.4 10*3/uL (ref 1.7–7.7)
Neutrophils Relative %: 63 %
Platelets: 299 10*3/uL (ref 150–400)
RBC: 3.2 MIL/uL — ABNORMAL LOW (ref 4.22–5.81)
RDW: 12.3 % (ref 11.5–15.5)
WBC: 11.8 10*3/uL — ABNORMAL HIGH (ref 4.0–10.5)
nRBC: 0 % (ref 0.0–0.2)

## 2023-07-11 LAB — TROPONIN I (HIGH SENSITIVITY)
Troponin I (High Sensitivity): 4 ng/L (ref <18)
Troponin I (High Sensitivity): 5 ng/L (ref <18)

## 2023-07-11 LAB — D-DIMER, QUANTITATIVE: D-Dimer, Quant: <0.27 ug{FEU}/mL (ref 0.00–0.50)

## 2023-07-11 MED ORDER — IPRATROPIUM-ALBUTEROL 0.5-2.5 (3) MG/3ML IN SOLN
3.0000 mL | Freq: Once | RESPIRATORY_TRACT | Status: AC
  Administered 2023-07-11: 3 mL via RESPIRATORY_TRACT
  Filled 2023-07-11: qty 3

## 2023-07-11 NOTE — ED Triage Notes (Signed)
 Pt was mowing the lawn and became pale and diaphoretic with dyspnea. Pt says he felt as if he was going to pass out. Pt says he has not been feeling normal x2 days. Pt denies chest pain, but won't clarify how he has been feeling bad. Pt says he feels better but is extremely thirsty. Pt drinking water in triage.    CBG 180

## 2023-07-11 NOTE — ED Provider Notes (Signed)
 [facility] Provider Note    Event Date/Time   First MD Initiated Contact with Patient 07/11/23 1836     (approximate)   History   Chief Complaint Near Syncope   HPI  Bobby Zuniga is a 58 y.o. male with past medical history of hyperlipidemia and asthma who presents to the ED complaining of shortness of breath.  Patient reports for the past 2 days he has been feeling short of breath with any exertion, denies any associated pain in his chest, fever, or cough.  He had a similar episode again today while mowing the lawn, when he also felt like he might pass out.  Symptoms have since resolved and he states his breathing feels normal when he is at rest.  He has not noticed any pain or swelling in his legs, denies any cardiac history.     Physical Exam   Triage Vital Signs: ED Triage Vitals  Encounter Vitals Group     BP 07/11/23 1615 (!) 158/95     Systolic BP Percentile --      Diastolic BP Percentile --      Pulse Rate 07/11/23 1615 (!) 111     Resp 07/11/23 1615 18     Temp 07/11/23 1615 98.2 F (36.8 C)     Temp Source 07/11/23 1615 Oral     SpO2 07/11/23 1615 100 %     Weight 07/11/23 1617 245 lb (111.1 kg)     Height 07/11/23 1617 5\' 11"  (1.803 m)     Head Circumference --      Peak Flow --      Pain Score 07/11/23 1617 0     Pain Loc --      Pain Education --      Exclude from Growth Chart --     Most recent vital signs: Vitals:   07/11/23 2030 07/11/23 2100  BP: 122/79 112/71  Pulse: 96 91  Resp: 19 14  Temp:    SpO2: 100% 100%    Constitutional: Alert and oriented. Eyes: Conjunctivae are normal. Head: Atraumatic. Nose: No congestion/rhinnorhea. Mouth/Throat: Mucous membranes are moist.  Cardiovascular: Normal rate, regular rhythm. Grossly normal heart sounds.  2+ radial pulses bilaterally. Respiratory: Normal respiratory effort.  No retractions. Lungs CTAB. Gastrointestinal: Soft and nontender. No  distention. Musculoskeletal: No lower extremity tenderness nor edema.  Neurologic:  Normal speech and language. No gross focal neurologic deficits are appreciated.    ED Results / Procedures / Treatments   Labs (all labs ordered are listed, but only abnormal results are displayed) Labs Reviewed  CBC WITH DIFFERENTIAL/PLATELET - Abnormal; Notable for the following components:      Result Value   WBC 11.8 (*)    RBC 3.20 (*)    Hemoglobin 9.5 (*)    HCT 28.2 (*)    Abs Immature Granulocytes 0.48 (*)    All other components within normal limits  COMPREHENSIVE METABOLIC PANEL WITH GFR - Abnormal; Notable for the following components:   Glucose, Bld 147 (*)    BUN 47 (*)    All other components within normal limits  D-DIMER, QUANTITATIVE  TROPONIN I (HIGH SENSITIVITY)  TROPONIN I (HIGH SENSITIVITY)     EKG  ED ECG REPORT I, Chesley Noon, the attending physician, personally viewed and interpreted this ECG.   Date: 07/11/2023  EKG Time: 18:47  Rate: 92  Rhythm: normal sinus rhythm  Axis: Normal  Intervals:none  ST&T Change: None  RADIOLOGY Chest x-ray reviewed  and interpreted by me with no infiltrate, edema, or effusion.  PROCEDURES:  Critical Care performed: No  Procedures   MEDICATIONS ORDERED IN ED: Medications  ipratropium-albuterol (DUONEB) 0.5-2.5 (3) MG/3ML nebulizer solution 3 mL (3 mLs Nebulization Given 07/11/23 1951)     IMPRESSION / MDM / ASSESSMENT AND PLAN / ED COURSE  I reviewed the triage vital signs and the nursing notes.                              58 y.o. male with past medical history of hyperlipidemia and asthma who presents to the ED complaining of dyspnea on exertion over the past 2 days with near syncopal episode earlier today.   Patient's presentation is most consistent with acute presentation with potential threat to life or bodily function.  Differential diagnosis includes, but is not limited to, ACS, PE, pneumonia, CHF, asthma  exacerbation, anemia, electrolyte abnormality, AKI.  Patient nontoxic-appearing and in no acute distress, vital signs remarkable for tachycardia but otherwise reassuring.  He denies any shortness of breath currently, lungs are clear to auscultation bilaterally.  EKG shows no evidence of arrhythmia or ischemia and initial troponin within normal limits.  We will observe on cardiac monitor and check second set troponin, also check D-dimer.  Labs without significant anemia, leukocytosis, electrolyte abnormality, or AKI.  Will observe on cardiac monitor and check second set troponin.  Repeat troponin within normal limits, D-dimer also unremarkable and I doubt PE given patient low risk by Wells criteria.  Patient appropriate for discharge home with cardiology follow-up, was counseled to return to the ED for new or worsening symptoms.  Patient agrees with plan.      FINAL CLINICAL IMPRESSION(S) / ED DIAGNOSES   Final diagnoses:  Near syncope  Dyspnea on exertion     Rx / DC Orders   ED Discharge Orders          Ordered    Ambulatory referral to Cardiology        07/11/23 2159             Note:  This document was prepared using Dragon voice recognition software and may include unintentional dictation errors.   Chesley Noon, MD 07/11/23 2200

## 2023-07-11 NOTE — ED Provider Triage Note (Signed)
 Emergency Medicine Provider Triage Evaluation Note  Bobby Zuniga , a 57 y.o. male  was evaluated in triage.  Pt complains of feeling diaphoretic with dyspnea when doing the lawn. Denies chest pain.  Review of Systems  Positive:  Negative:   Physical Exam  BP (!) 158/95   Pulse (!) 111   Temp 98.2 F (36.8 C) (Oral)   Resp 18   Ht 5\' 11"  (1.803 m)   Wt 111.1 kg   SpO2 100%   BMI 34.17 kg/m  Gen:   Awake, no distress   Resp:  Normal effort  MSK:   Moves extremities without difficulty  Other:    Medical Decision Making  Medically screening exam initiated at 4:28 PM.  Appropriate orders placed.  Bobby Zuniga was informed that the remainder of the evaluation will be completed by another provider, this initial triage assessment does not replace that evaluation, and the importance of remaining in the ED until their evaluation is complete.  Patient near syncope, ordered CBC,CMP   Gladys Damme, PA-C 07/11/23 1630

## 2023-07-26 ENCOUNTER — Encounter: Payer: Self-pay | Admitting: Cardiovascular Disease

## 2023-07-26 ENCOUNTER — Ambulatory Visit (INDEPENDENT_AMBULATORY_CARE_PROVIDER_SITE_OTHER): Payer: Self-pay | Admitting: Cardiovascular Disease

## 2023-07-26 VITALS — BP 115/68 | HR 101 | Ht 71.0 in | Wt 251.6 lb

## 2023-07-26 DIAGNOSIS — I2510 Atherosclerotic heart disease of native coronary artery without angina pectoris: Secondary | ICD-10-CM

## 2023-07-26 DIAGNOSIS — R42 Dizziness and giddiness: Secondary | ICD-10-CM

## 2023-07-26 DIAGNOSIS — R609 Edema, unspecified: Secondary | ICD-10-CM

## 2023-07-26 DIAGNOSIS — I5033 Acute on chronic diastolic (congestive) heart failure: Secondary | ICD-10-CM

## 2023-07-26 DIAGNOSIS — R0602 Shortness of breath: Secondary | ICD-10-CM

## 2023-07-26 NOTE — Progress Notes (Signed)
 Cardiology Office Note   Date:  07/26/2023   ID:  Bobby Zuniga, DOB Oct 14, 1965, MRN [id number]  PCP:  [facility]  Cardiologist:  Debborah Fairly, MD      History of Present Illness: Bobby Zuniga is a 58 y.o. male who presents for  Chief Complaint  Patient presents with   Follow-up    Hospital follow up/consult    [id number] says cannot breath on minimal exertion. He was seen in ER at [facility].Troponins were 5, and 5. He had hemoglobin 9.5. He nearly passed out at work due to SOB.  Shortness of Breath This is a new problem. The current episode started more than 1 month ago. The problem occurs intermittently. The problem has been gradually worsening. Associated symptoms include leg pain, orthopnea, PND and syncope. Pertinent negatives include no chest pain or claudication.      Past Medical History:  Diagnosis Date   Depression    Hyperlipemia      History reviewed. No pertinent surgical history.   Current Outpatient Medications  Medication Sig Dispense Refill   albuterol  (VENTOLIN  HFA) 108 (90 Base) MCG/ACT inhaler INHALE 2 PUFFS BY ORAL INHALATION FOUR TIMES A DAY AS NEEDED FOR SHORTNESS OF BREATH/WHEEZING     atorvastatin (LIPITOR) 20 MG tablet TAKE ONE-HALF TABLET BY MOUTH AT BEDTIME FOR CHOLESTEROL     buPROPion (WELLBUTRIN XL) 150 MG 24 hr tablet TAKE ONE TAB BY MOUTH EVERY 24 HOURS FOR MOOD     ibuprofen (ADVIL) 600 MG tablet TAKE ONE TABLET BY MOUTH ONCE EVERY DAY AS NEEDED FOR PAIN AND INFLAMMATION WITH FOOD AS NEEDED FOR PAIN AND INFLAMMATION.     venlafaxine XR (EFFEXOR-XR) 150 MG 24 hr capsule TAKE ONE CAPSULE BY MOUTH EVERY DAY MOOD     No current facility-administered medications for this visit.    Allergies:   Patient has no known allergies.    Social History:   reports that he has been smoking. He has never used smokeless tobacco. He reports that he does not drink alcohol and does not use drugs.   Family History:  family history is not on  file.    ROS:     Review of Systems  Constitutional: Negative.   HENT: Negative.    Eyes: Negative.   Respiratory:  Positive for shortness of breath.   Cardiovascular:  Positive for orthopnea, syncope and PND. Negative for chest pain and claudication.  Gastrointestinal: Negative.   Genitourinary: Negative.   Musculoskeletal: Negative.   Skin: Negative.   Endo/Heme/Allergies: Negative.   Psychiatric/Behavioral: Negative.    All other systems reviewed and are negative.     All other systems are reviewed and negative.    PHYSICAL EXAM: VS:  BP 115/68   Pulse (!) 101   Ht 5\' 11"  (1.803 m)   Wt 251 lb 9.6 oz (114.1 kg)   SpO2 95%   BMI 35.09 kg/m  , BMI Body mass index is 35.09 kg/m. Last weight:  Wt Readings from Last 3 Encounters:  07/26/23 251 lb 9.6 oz (114.1 kg)  07/11/23 245 lb (111.1 kg)  07/02/23 245 lb (111.1 kg)     Physical Exam Vitals reviewed.  Constitutional:      Appearance: Normal appearance. He is normal weight.  HENT:     Head: Normocephalic.     Nose: Nose normal.     Mouth/Throat:     Mouth: Mucous membranes are moist.  Eyes:     Pupils: Pupils are equal, round,  and reactive to light.  Cardiovascular:     Rate and Rhythm: Normal rate and regular rhythm.     Pulses: Normal pulses.     Heart sounds: Normal heart sounds.  Pulmonary:     Effort: Pulmonary effort is normal.  Abdominal:     General: Abdomen is flat. Bowel sounds are normal.  Musculoskeletal:        General: Normal range of motion.     Cervical back: Normal range of motion.  Skin:    General: Skin is warm.  Neurological:     General: No focal deficit present.     Mental Status: He is alert.  Psychiatric:        Mood and Affect: Mood normal.       EKG: NSR 90/min no acute changes  Recent Labs: 07/11/2023: ALT 15; BUN 47; Creatinine, Ser 1.23; Hemoglobin 9.5; Platelets 299; Potassium 4.1; Sodium 138    Lipid Panel No results found for: "CHOL", "TRIG", "HDL",  "CHOLHDL", "VLDL", "LDLCALC", "LDLDIRECT"    Other studies Reviewed: Additional studies/ records that were reviewed today include:  Review of the above records demonstrates:       No data to display            ASSESSMENT AND PLAN:    ICD-10-CM   1. SOB (shortness of breath)  R06.02 PCV ECHOCARDIOGRAM COMPLETE    MYOCARDIAL PERFUSION IMAGING   Has probably acute coronary syndrome VS CHF. R/o for MI    2. Edema, unspecified type  R60.9 PCV ECHOCARDIOGRAM COMPLETE    MYOCARDIAL PERFUSION IMAGING    3. CHF (congestive heart failure), NYHA class III, acute on chronic, diastolic (HCC)  I50.33 PCV ECHOCARDIOGRAM COMPLETE    MYOCARDIAL PERFUSION IMAGING   stress test and echo    4. Dizziness  R42 PCV ECHOCARDIOGRAM COMPLETE    MYOCARDIAL PERFUSION IMAGING    5. Coronary artery disease involving native coronary artery of native heart without angina pectoris  I25.10 PCV ECHOCARDIOGRAM COMPLETE    MYOCARDIAL PERFUSION IMAGING       Problem List Items Addressed This Visit   None Visit Diagnoses       SOB (shortness of breath)    -  Primary   Has probably acute coronary syndrome VS CHF. R/o for MI   Relevant Orders   PCV ECHOCARDIOGRAM COMPLETE   MYOCARDIAL PERFUSION IMAGING     Edema, unspecified type       Relevant Orders   PCV ECHOCARDIOGRAM COMPLETE   MYOCARDIAL PERFUSION IMAGING     CHF (congestive heart failure), NYHA class III, acute on chronic, diastolic (HCC)       stress test and echo   Relevant Orders   PCV ECHOCARDIOGRAM COMPLETE   MYOCARDIAL PERFUSION IMAGING     Dizziness       Relevant Orders   PCV ECHOCARDIOGRAM COMPLETE   MYOCARDIAL PERFUSION IMAGING     Coronary artery disease involving native coronary artery of native heart without angina pectoris       Relevant Orders   PCV ECHOCARDIOGRAM COMPLETE   MYOCARDIAL PERFUSION IMAGING          Disposition:   Return in about 3 weeks (around 08/16/2023) for echo, stress test and f/u.    Total time  spent: 50 minutes  Signed,  Debborah Fairly, MD  07/26/2023 1:48 PM    [facility]

## 2023-07-27 ENCOUNTER — Telehealth: Payer: Self-pay

## 2023-07-27 NOTE — Telephone Encounter (Signed)
 Pt called asking if he is able to go workout in the gym again? Please advise

## 2023-08-02 ENCOUNTER — Other Ambulatory Visit

## 2023-08-06 ENCOUNTER — Encounter

## 2023-08-20 ENCOUNTER — Ambulatory Visit: Admitting: Cardiovascular Disease
